# Patient Record
Sex: Male | Born: 1980 | Race: Black or African American | Hispanic: No | Marital: Single | Smoking: Current every day smoker
Health system: Southern US, Community
[De-identification: ages and names within clinical notes are randomized; demographics above are authoritative.]

## PROBLEM LIST (undated history)

## (undated) HISTORY — PX: HERNIA REPAIR: SHX51

---
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
## 2002-10-12 ENCOUNTER — Emergency Department (HOSPITAL_COMMUNITY): Admission: EM | Admit: 2002-10-12 | Discharge: 2002-10-12 | Payer: Self-pay | Admitting: Emergency Medicine

## 2002-10-14 ENCOUNTER — Emergency Department (HOSPITAL_COMMUNITY): Admission: EM | Admit: 2002-10-14 | Discharge: 2002-10-14 | Payer: Self-pay | Admitting: Emergency Medicine

## 2002-10-22 ENCOUNTER — Emergency Department (HOSPITAL_COMMUNITY): Admission: EM | Admit: 2002-10-22 | Discharge: 2002-10-22 | Payer: Self-pay | Admitting: Emergency Medicine

## 2002-10-29 ENCOUNTER — Emergency Department (HOSPITAL_COMMUNITY): Admission: EM | Admit: 2002-10-29 | Discharge: 2002-10-29 | Payer: Self-pay | Admitting: Diagnostic Radiology

## 2003-02-25 ENCOUNTER — Emergency Department (HOSPITAL_COMMUNITY): Admission: EM | Admit: 2003-02-25 | Discharge: 2003-02-25 | Payer: Self-pay | Admitting: Emergency Medicine

## 2005-02-01 ENCOUNTER — Emergency Department (HOSPITAL_COMMUNITY): Admission: EM | Admit: 2005-02-01 | Discharge: 2005-02-01 | Payer: Self-pay | Admitting: Emergency Medicine

## 2005-03-03 ENCOUNTER — Emergency Department (HOSPITAL_COMMUNITY): Admission: EM | Admit: 2005-03-03 | Discharge: 2005-03-03 | Payer: Self-pay | Admitting: Emergency Medicine

## 2006-03-21 ENCOUNTER — Ambulatory Visit: Payer: Self-pay | Admitting: Family Medicine

## 2006-04-15 ENCOUNTER — Ambulatory Visit: Payer: Self-pay | Admitting: Family Medicine

## 2006-04-16 ENCOUNTER — Ambulatory Visit: Payer: Self-pay | Admitting: *Deleted

## 2006-09-16 ENCOUNTER — Ambulatory Visit: Payer: Self-pay | Admitting: Internal Medicine

## 2006-10-24 ENCOUNTER — Ambulatory Visit: Payer: Self-pay | Admitting: Internal Medicine

## 2007-01-12 ENCOUNTER — Emergency Department (HOSPITAL_COMMUNITY): Admission: EM | Admit: 2007-01-12 | Discharge: 2007-01-12 | Payer: Self-pay | Admitting: Emergency Medicine

## 2007-03-19 ENCOUNTER — Encounter (INDEPENDENT_AMBULATORY_CARE_PROVIDER_SITE_OTHER): Payer: Self-pay | Admitting: *Deleted

## 2008-11-23 ENCOUNTER — Emergency Department (HOSPITAL_COMMUNITY): Admission: EM | Admit: 2008-11-23 | Discharge: 2008-11-23 | Payer: Self-pay | Admitting: Emergency Medicine

## 2010-06-01 ENCOUNTER — Emergency Department (HOSPITAL_COMMUNITY)
Admission: EM | Admit: 2010-06-01 | Discharge: 2010-06-01 | Payer: Self-pay | Source: Home / Self Care | Admitting: Family Medicine

## 2011-06-08 ENCOUNTER — Emergency Department (HOSPITAL_COMMUNITY)
Admission: EM | Admit: 2011-06-08 | Discharge: 2011-06-08 | Disposition: A | Discharge status: Home / Self Care | Payer: No Typology Code available for payment source | Attending: Emergency Medicine | Admitting: Emergency Medicine

## 2011-06-08 ENCOUNTER — Encounter: Payer: Self-pay | Admitting: Emergency Medicine

## 2011-06-08 ENCOUNTER — Emergency Department (HOSPITAL_COMMUNITY): Payer: No Typology Code available for payment source

## 2011-06-08 DIAGNOSIS — S39012A Strain of muscle, fascia and tendon of lower back, initial encounter: Secondary | ICD-10-CM

## 2011-06-08 DIAGNOSIS — Y92410 Unspecified street and highway as the place of occurrence of the external cause: Secondary | ICD-10-CM | POA: Insufficient documentation

## 2011-06-08 DIAGNOSIS — S335XXA Sprain of ligaments of lumbar spine, initial encounter: Secondary | ICD-10-CM | POA: Insufficient documentation

## 2011-06-08 MED ORDER — IBUPROFEN 800 MG PO TABS: 800.0000 mg | ORAL_TABLET | Freq: Three times a day (TID) | ORAL | Status: AC | PRN

## 2011-06-08 MED ORDER — HYDROCODONE-ACETAMINOPHEN 5-325 MG PO TABS: 1.0000 | ORAL_TABLET | Freq: Four times a day (QID) | ORAL | Status: AC | PRN

## 2011-06-08 NOTE — ED Provider Notes (Signed)
Medical screening examination/treatment/procedure(s) were performed by non-physician practitioner and as supervising physician I was immediately available for consultation/collaboration.  Kimi Bordeau, MD 06/08/11 1652 
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
## 2011-11-02 ENCOUNTER — Emergency Department (INDEPENDENT_AMBULATORY_CARE_PROVIDER_SITE_OTHER)
Admission: EM | Admit: 2011-11-02 | Discharge: 2011-11-02 | Disposition: A | Discharge status: Home / Self Care | Payer: Self-pay | Source: Home / Self Care | Attending: Emergency Medicine | Admitting: Emergency Medicine

## 2011-11-02 ENCOUNTER — Encounter (HOSPITAL_COMMUNITY): Payer: Self-pay | Admitting: *Deleted

## 2011-11-02 DIAGNOSIS — R369 Urethral discharge, unspecified: Secondary | ICD-10-CM

## 2011-11-02 MED ORDER — CEFTRIAXONE SODIUM 250 MG IJ SOLR
INTRAMUSCULAR | Status: AC
  Filled 2011-11-02: qty 250

## 2011-11-02 MED ORDER — AZITHROMYCIN 250 MG PO TABS
ORAL_TABLET | ORAL | Status: AC
  Filled 2011-11-02: qty 4

## 2011-11-02 MED ORDER — CEFTRIAXONE SODIUM 250 MG IJ SOLR
250.0000 mg | Freq: Once | INTRAMUSCULAR | Status: AC
  Administered 2011-11-02: 250 mg via INTRAMUSCULAR

## 2011-11-02 MED ORDER — AZITHROMYCIN 250 MG PO TABS
1000.0000 mg | ORAL_TABLET | Freq: Once | ORAL | Status: AC
  Administered 2011-11-02: 1000 mg via ORAL

## 2011-11-02 MED ORDER — LIDOCAINE HCL (PF) 1 % IJ SOLN
INTRAMUSCULAR | Status: AC
  Filled 2011-11-02: qty 5

## 2011-11-02 NOTE — ED Notes (Signed)
Pt is here with complaints of possible STD exposure.  Complaining of burning with urination and white penile discharge.  Denies pain.
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
## 2011-11-05 ENCOUNTER — Telehealth (HOSPITAL_COMMUNITY): Payer: Self-pay | Admitting: *Deleted

## 2011-11-05 NOTE — ED Notes (Signed)
Pt. called in for his lab results. Verified x 2 and given neg. GC/Chlamydia results. Vassie Moselle 11/05/2011

## 2012-02-27 ENCOUNTER — Encounter (HOSPITAL_COMMUNITY): Payer: Self-pay | Admitting: *Deleted

## 2012-02-27 ENCOUNTER — Emergency Department (HOSPITAL_COMMUNITY)
Admission: EM | Admit: 2012-02-27 | Discharge: 2012-02-27 | Disposition: A | Discharge status: Home / Self Care | Payer: 59 | Source: Home / Self Care | Attending: Family Medicine | Admitting: Family Medicine

## 2012-02-27 DIAGNOSIS — H00019 Hordeolum externum unspecified eye, unspecified eyelid: Secondary | ICD-10-CM

## 2012-02-27 DIAGNOSIS — H01009 Unspecified blepharitis unspecified eye, unspecified eyelid: Secondary | ICD-10-CM

## 2012-02-27 DIAGNOSIS — H01006 Unspecified blepharitis left eye, unspecified eyelid: Secondary | ICD-10-CM

## 2012-02-27 DIAGNOSIS — H00016 Hordeolum externum left eye, unspecified eyelid: Secondary | ICD-10-CM

## 2012-02-27 MED ORDER — IBUPROFEN 600 MG PO TABS: 600.0000 mg | ORAL_TABLET | Freq: Three times a day (TID) | ORAL | Status: DC

## 2012-02-27 MED ORDER — ERYTHROMYCIN 5 MG/GM OP OINT: TOPICAL_OINTMENT | OPHTHALMIC | Status: DC

## 2012-02-27 MED ORDER — DOXYCYCLINE HYCLATE 100 MG PO CAPS: 100.0000 mg | ORAL_CAPSULE | Freq: Two times a day (BID) | ORAL | Status: DC

## 2012-02-27 NOTE — ED Notes (Signed)
2 days of left eye redness and pain

## 2012-02-29 NOTE — ED Provider Notes (Signed)
History     CSN: [id number]  Arrival date & time 02/27/12  1122   First MD Initiated Contact with Patient 02/27/12 1147      Chief Complaint  Patient presents with  . Eye Pain    (Consider location/radiation/quality/duration/timing/severity/associated sxs/prior treatment) HPI Comments: 31 y/o male, smoker otherwise no significant PMH here c/o left eye redness, tenderness, swelling and drainage from upper lid for 2 days. Using warm compresses since yesterday started to drain last night. No pain with eye movements.    History reviewed. No pertinent past medical history.  Past Surgical History  Procedure Date  . Hernia repair     Family History  Problem Relation Age of Onset  . Hypertension Mother   . Diabetes Mother     History  Substance Use Topics  . Smoking status: Current Everyday Smoker  . Smokeless tobacco: Not on file  . Alcohol Use: Yes      Review of Systems  Constitutional: Negative for fever and chills.  HENT: Negative for congestion, sore throat, rhinorrhea, neck pain, neck stiffness and sinus pressure.   Eyes: Positive for pain, discharge and redness. Negative for photophobia and visual disturbance.       Symptoms related to left upper lid as per HPI  Respiratory: Negative for cough.   Neurological: Negative for dizziness and headaches.    Allergies  Review of patient's allergies indicates no known allergies.  Home Medications   Current Outpatient Rx  Name Route Sig Dispense Refill  . DOXYCYCLINE HYCLATE 100 MG PO CAPS Oral Take 1 capsule (100 mg total) by mouth 2 (two) times daily. 14 capsule 0  . ERYTHROMYCIN 5 MG/GM OP OINT  Place a 1/2 inch ribbon of ointment into the left lower eyelid TID for 5 days 1 g 0  . IBUPROFEN 600 MG PO TABS Oral Take 1 tablet (600 mg total) by mouth 3 (three) times daily. 20 tablet 0    BP 141/89  Pulse 68  Temp 98.1 F (36.7 C) (Oral)  Resp 18  SpO2 99%  Physical Exam  Nursing note and vitals  reviewed. Constitutional: He is oriented to person, place, and time. He appears well-developed and well-nourished. No distress.  HENT:  Head: Normocephalic and atraumatic.  Right Ear: External ear normal.  Mouth/Throat: Oropharynx is clear and moist. No oropharyngeal exudate.  Eyes: Conjunctivae and EOM are normal. Pupils are equal, round, and reactive to light.       Left upper lid: Mild swelling erythema and small induration close to eyelashes drainage point. There is small white /yellow drainage. Mild associated upper and lower blepharitis. No periorbital edema erythema or tenderness.  Right upper lid with non infected chalazion.   Neck: Neck supple.  Cardiovascular: Normal rate, regular rhythm and normal heart sounds.  Exam reveals no gallop and no friction rub.   No murmur heard. Pulmonary/Chest: Breath sounds normal.  Lymphadenopathy:    He has no cervical adenopathy.  Neurological: He is alert and oriented to person, place, and time.  Skin: No rash noted.    ED Course  Procedures (including critical care time)  Labs Reviewed - No data to display No results found.   1. Hordeolum externum of left eye   2. Blepharitis of left eye       MDM  Treated with doxycycline oral and erythromycin topical. Ophthalmology referral provided as needed.         Sharin Grave, MD 03/02/12 [phone]

## 2012-03-01 ENCOUNTER — Emergency Department (HOSPITAL_COMMUNITY)
Admission: EM | Admit: 2012-03-01 | Discharge: 2012-03-01 | Disposition: A | Discharge status: Home / Self Care | Payer: 59 | Attending: Emergency Medicine | Admitting: Emergency Medicine

## 2012-03-01 ENCOUNTER — Encounter (HOSPITAL_COMMUNITY): Payer: Self-pay | Admitting: Emergency Medicine

## 2012-03-01 DIAGNOSIS — H00019 Hordeolum externum unspecified eye, unspecified eyelid: Secondary | ICD-10-CM | POA: Insufficient documentation

## 2012-03-01 DIAGNOSIS — F17200 Nicotine dependence, unspecified, uncomplicated: Secondary | ICD-10-CM | POA: Insufficient documentation

## 2012-03-01 NOTE — ED Notes (Addendum)
Pt c/o left eye redness and pain onset Monday. Pt seen at urgent care for same and given medications without relief. Left eye redness noted with small bump on lower eye lid noted.

## 2012-03-01 NOTE — ED Provider Notes (Signed)
History  This chart was scribed for Elijah Kaplan, MD by Erskine Emery. This patient was seen in room TR02C/TR02C and the patient's care was started at 14:49.   CSN: [id number]  Arrival date & time 03/01/12  1436   First MD Initiated Contact with Patient 03/01/12 1449      Chief Complaint  Patient presents with  . Eye Pain    left eye    (Consider location/radiation/quality/duration/timing/severity/associated sxs/prior treatment) HPI Sabre A Gaba is a 31 y.o. male who presents to the Emergency Department complaining of left eye irritation, redness, itching, yellow discharge, and periorbital swelling since Monday (5 days). Pt denies any eye pain, visual changes, or presence of foreign bodies in the eye. Pt reports when he wakes up his top and bottom lids on the left eye are stuck shut, with noticeable crusting, and only will open after he has put a warm wet rag no the area. Pt went to Urgent Care a week ago and was given antibiotics and gel to rub on the eye that he has been taking as prescribed.   History reviewed. No pertinent past medical history.  Past Surgical History  Procedure Date  . Hernia repair     Family History  Problem Relation Age of Onset  . Hypertension Mother   . Diabetes Mother     History  Substance Use Topics  . Smoking status: Current Everyday Smoker  . Smokeless tobacco: Not on file  . Alcohol Use: Yes      Review of Systems  Constitutional: Negative for fever and chills.  Eyes: Positive for discharge, redness and itching. Negative for pain and visual disturbance.  Respiratory: Negative for shortness of breath.   Gastrointestinal: Negative for nausea and vomiting.  Neurological: Negative for weakness.    Allergies  Review of patient's allergies indicates no known allergies.  Home Medications   Current Outpatient Rx  Name Route Sig Dispense Refill  . DOXYCYCLINE HYCLATE 100 MG PO CAPS Oral Take 100 mg by mouth 2 (two) times daily.    .  ERYTHROMYCIN 5 MG/GM OP OINT Left Eye Place into the left eye See admin instructions. Place a 1/2 inch ribbon of ointment into the left lower eyelid TID for 5 days      BP 153/82  Pulse 83  Temp 98.2 F (36.8 C) (Oral)  Resp 18  SpO2 100%  Physical Exam  Nursing note and vitals reviewed. Constitutional: He is oriented to person, place, and time. He appears well-developed and well-nourished. No distress.  HENT:  Head: Normocephalic and atraumatic.  Eyes: EOM are normal. Pupils are equal, round, and reactive to light. No scleral icterus.       Right eyelid: macular lesion over the top of the eyelid. No erythema or injection. Sclera is clear.  Left eye: periorbital swellling with no erythema. The bottom lid has papular lesions with no pustules. Sclera are slightly injected, medially and laterally but are otherwise clear.  Neck: Neck supple. No tracheal deviation present.       No pre or post auricular lymphadenopathy.  Cardiovascular: Normal rate, regular rhythm and normal heart sounds.   Pulmonary/Chest: Effort normal and breath sounds normal. No respiratory distress. He has no wheezes.  Abdominal: Soft. There is no tenderness.  Musculoskeletal: Normal range of motion.  Neurological: He is alert and oriented to person, place, and time.  Skin: Skin is warm and dry.  Psychiatric: He has a normal mood and affect. His behavior is normal.  ED Course  Procedures (including critical care time)  DIAGNOSTIC STUDIES: Oxygen Saturation is 100% on room air, normal by my interpretation.    COORDINATION OF CARE: 15:20--I evaluated the patient and we discussed a treatment plan including a warm compress and antibiotics to which the pt agreed. I told the pt it may take a week-10 days for the symptoms to disappear.    Labs Reviewed - No data to display No results found.   No diagnosis found.    MDM  Pt comes in with cc of eye irritation. Was seen at urgent care recently, on po  antibiotics and using warm compresses. Eye exam is shows some periorbital swelling, no cellulitis, and both the eyes have stye, with no purulence. No concerns for abrasions or ulcers of the eye or any corneal, retinal injury based on hx..  Advised to use warm compresses. PCP f/u.     the metatarsal over the greattoe is tenderwithsome skin changes, diffuse bilateral toe pain with skin changes.  Elijah Kaplan, MD 03/01/12 [phone]

## 2012-03-30 ENCOUNTER — Emergency Department (HOSPITAL_COMMUNITY)
Admission: EM | Admit: 2012-03-30 | Discharge: 2012-03-31 | Disposition: A | Discharge status: Home / Self Care | Payer: Self-pay | Attending: Emergency Medicine | Admitting: Emergency Medicine

## 2012-03-30 DIAGNOSIS — Z0389 Encounter for observation for other suspected diseases and conditions ruled out: Secondary | ICD-10-CM | POA: Insufficient documentation

## 2012-03-30 NOTE — ED Notes (Signed)
Pt called for triage x2 without response 

## 2012-03-31 ENCOUNTER — Emergency Department (HOSPITAL_COMMUNITY)
Admission: EM | Admit: 2012-03-31 | Discharge: 2012-03-31 | Disposition: A | Discharge status: Home / Self Care | Payer: Self-pay | Attending: Emergency Medicine | Admitting: Emergency Medicine

## 2012-03-31 ENCOUNTER — Encounter (HOSPITAL_COMMUNITY): Payer: Self-pay | Admitting: Family Medicine

## 2012-03-31 DIAGNOSIS — F17200 Nicotine dependence, unspecified, uncomplicated: Secondary | ICD-10-CM | POA: Insufficient documentation

## 2012-03-31 DIAGNOSIS — H00019 Hordeolum externum unspecified eye, unspecified eyelid: Secondary | ICD-10-CM | POA: Insufficient documentation

## 2012-03-31 DIAGNOSIS — I10 Essential (primary) hypertension: Secondary | ICD-10-CM | POA: Insufficient documentation

## 2012-03-31 DIAGNOSIS — E119 Type 2 diabetes mellitus without complications: Secondary | ICD-10-CM | POA: Insufficient documentation

## 2012-03-31 DIAGNOSIS — L0100 Impetigo, unspecified: Secondary | ICD-10-CM | POA: Insufficient documentation

## 2012-03-31 MED ORDER — DOXYCYCLINE HYCLATE 100 MG PO CAPS: 100.0000 mg | ORAL_CAPSULE | Freq: Two times a day (BID) | ORAL | Status: DC

## 2012-03-31 NOTE — ED Provider Notes (Signed)
History  This chart was scribed for Elijah Bernhardt, MD by Elijah Walsh. This patient was seen in room TR08C/TR08C and the patient's care was started at 8:40PM.  CSN: [id number]  Arrival date & time 03/31/12  1749   First MD Initiated Contact with Patient 03/31/12  2040    Chief Complaint  Patient presents with  . Insect Bite    The history is provided by the patient. No language interpreter was used.    [name] is a 31 y.o. male who presents to the Emergency Department complaining of sudden onset, non-changing, constant insect bites to knee and arm that occurred when he saw a spider crawl out of his shirt at his son's football game 2 days ago. He reports purulent drainage from the areas and states that the areas are itchy. He denies fever, nausea, emesis, HA and rash as associated symptoms.He does not have a h/o chronic medical conditions. He is a current everyday smoker and occasional alcohol user.   History reviewed. No pertinent past medical history.  Past Surgical History  Procedure Date  . Hernia repair     Family History  Problem Relation Age of Onset  . Hypertension Mother   . Diabetes Mother     History  Substance Use Topics  . Smoking status: Current Every Day Smoker  . Smokeless tobacco: Not on file  . Alcohol Use: Yes      Review of Systems  A complete 10 system review of systems was obtained and all systems are negative except as noted in the HPI and PMH.    Allergies  Review of patient's allergies indicates no known allergies.  Home Medications   Current Outpatient Rx  Name Route Sig Dispense Refill  . PRESCRIPTION MEDICATION Oral Take 1 tablet by mouth daily. Anti biotic, finished taking today 03-31-12    . DOXYCYCLINE HYCLATE 100 MG PO CAPS Oral Take 1 capsule (100 mg total) by mouth 2 (two) times daily. 20 capsule 0    Triage Vitals: BP 151/87  Pulse 92  Temp 97.7 F (36.5 C) (Oral)  Resp 18  SpO2 100%  Physical Exam  Nursing  note and vitals reviewed. Constitutional: He is oriented to person, place, and time. He appears well-developed and well-nourished. No distress.  HENT:  Head: Normocephalic and atraumatic.  Eyes: Conjunctivae normal and EOM are normal.       Sty on the left lower eyelid and left upper eyelid  Neck: Neck supple. No tracheal deviation present.  Cardiovascular: Normal rate.   Pulmonary/Chest: Effort normal. No respiratory distress.  Abdominal: He exhibits no distension.  Musculoskeletal: Normal range of motion.  Neurological: He is alert and oriented to person, place, and time. No sensory deficit.  Skin: Skin is dry.       Small slightly raised erythematous areas with excoriation and purulent drainage on the left elbow, left hand, right ankle, and three on the left knee, no fluctuance   Psychiatric: He has a normal mood and affect. His behavior is normal.    ED Course  Procedures (including critical care time)  DIAGNOSTIC STUDIES: Oxygen Saturation is 100% on room air, normal by my interpretation.    COORDINATION OF CARE: 8:55PM-Discussed discharge plan which includes cleaning the areas with antibacterial soap, antibiotics and applying heat to the sty with pt at bedside and pt agreed to plan.   Labs Reviewed - No data to display No results found.   1. Impetigo   2. Hordeolum  MDM  Scattered skin infection, without evidence for cellulitis, sepsis, metabolic instability.   Plan: Home Medications- doxycycline; Home Treatments- wash areas with antibacterial soap, apply heat to the sty, take antibiotics as prescribed; Recommended follow up- with ophthalmologist if sty doesn't improve      I personally performed the services described in this documentation, which was scribed in my presence. The recorded information has been reviewed and considered.     Flint Melter, MD 04/04/12 [phone]

## 2012-03-31 NOTE — ED Notes (Signed)
Pt for discharge.Vital signs stable.GCS 15 

## 2012-03-31 NOTE — ED Notes (Signed)
Per pt multiple bites to knee, and arm. sts was at his sons football game and saw spiders crawl out of his shirt. sts areas have been draining and are itchy.

## 2012-09-28 ENCOUNTER — Emergency Department (HOSPITAL_COMMUNITY)
Admission: EM | Admit: 2012-09-28 | Discharge: 2012-09-28 | Disposition: A | Discharge status: Home / Self Care | Payer: 59 | Attending: Emergency Medicine | Admitting: Emergency Medicine

## 2012-09-28 ENCOUNTER — Encounter (HOSPITAL_COMMUNITY): Payer: Self-pay | Admitting: Emergency Medicine

## 2012-09-28 DIAGNOSIS — H00014 Hordeolum externum left upper eyelid: Secondary | ICD-10-CM

## 2012-09-28 DIAGNOSIS — H00019 Hordeolum externum unspecified eye, unspecified eyelid: Secondary | ICD-10-CM | POA: Insufficient documentation

## 2012-09-28 DIAGNOSIS — R21 Rash and other nonspecific skin eruption: Secondary | ICD-10-CM | POA: Insufficient documentation

## 2012-09-28 DIAGNOSIS — F17200 Nicotine dependence, unspecified, uncomplicated: Secondary | ICD-10-CM | POA: Insufficient documentation

## 2012-09-28 MED ORDER — CEPHALEXIN 500 MG PO CAPS: 500.0000 mg | ORAL_CAPSULE | Freq: Two times a day (BID) | ORAL | Status: DC

## 2012-09-28 NOTE — ED Provider Notes (Signed)
History     CSN: [id number]  Arrival date & time 09/28/12  1058   First MD Initiated Contact with Patient 09/28/12 1156      Chief Complaint  Patient presents with  . Belepharitis    (Consider location/radiation/quality/duration/timing/severity/associated sxs/prior treatment) HPI  Elijah Walsh is a 32 y.o. male complaining of right upper eyelid swelling getting worse over the course of the last week. Patient has had multiple similar prior exacerbations. He's been using warm compresses approximately once per day with little relief. He's never had a cystitis large. He denies fever, change in vision, pain with eye movement.   History reviewed. No pertinent past medical history.  Past Surgical History  Procedure Laterality Date  . Hernia repair      Family History  Problem Relation Age of Onset  . Hypertension Mother   . Diabetes Mother     History  Substance Use Topics  . Smoking status: Current Every Day Smoker  . Smokeless tobacco: Not on file  . Alcohol Use: Yes      Review of Systems  Constitutional: Negative for fever.  Respiratory: Negative for shortness of breath.   Cardiovascular: Negative for chest pain.  Gastrointestinal: Negative for nausea, vomiting, abdominal pain and diarrhea.  Skin: Positive for rash.  All other systems reviewed and are negative.    Allergies  Review of patient's allergies indicates no known allergies.  Home Medications   Current Outpatient Rx  Name  Route  Sig  Dispense  Refill  . cephALEXin (KEFLEX) 500 MG capsule   Oral   Take 1 capsule (500 mg total) by mouth 2 (two) times daily.   14 capsule   0     BP 167/97  Pulse 75  Temp(Src) 97.5 F (36.4 C) (Oral)  Resp 20  SpO2 98%  Physical Exam  Nursing note and vitals reviewed. Constitutional: He is oriented to person, place, and time. He appears well-developed and well-nourished. No distress.  HENT:  Head: Normocephalic.  Mouth/Throat: Oropharynx is clear and  moist.  Eyes: Conjunctivae and EOM are normal. Pupils are equal, round, and reactive to light.    Neck: Normal range of motion.  Cardiovascular: Normal rate.   Pulmonary/Chest: Effort normal. No stridor.  Musculoskeletal: Normal range of motion.  Lymphadenopathy:    He has no cervical adenopathy.  Neurological: He is alert and oriented to person, place, and time.  Psychiatric: He has a normal mood and affect.    ED Course  Procedures (including critical care time)  Labs Reviewed - No data to display No results found.   1. Hordeolum externum of left upper eyelid       MDM   Elijah Walsh is a 31 y.o. male  With right upper eyelid hordeolum.   Filed Vitals:   09/28/12 1128  BP: 167/97  Pulse: 75  Temp: 97.5 F (36.4 C)  TempSrc: Oral  Resp: 20  SpO2: 98%     Pt verbalized understanding and agrees with care plan. Outpatient follow-up and return precautions given.    Discharge Medication List as of 09/28/2012 12:32 PM    START taking these medications   Details  cephALEXin (KEFLEX) 500 MG capsule Take 1 capsule (500 mg total) by mouth 2 (two) times daily., Starting 09/28/2012, Until Discontinued, [employer], PA-C 09/29/12 [phone]

## 2012-09-28 NOTE — ED Notes (Signed)
Pt c/o swelling to R eyelid. Began 1 week ago and has gotten progressively worse. Eyelid red and swollen in center.

## 2012-09-29 NOTE — ED Provider Notes (Signed)
Medical screening examination/treatment/procedure(s) were performed by non-physician practitioner and as supervising physician I was immediately available for consultation/collaboration.  Flint Melter, MD 09/29/12 [phone]

## 2013-05-10 ENCOUNTER — Encounter (HOSPITAL_COMMUNITY): Payer: Self-pay | Admitting: Emergency Medicine

## 2013-05-10 ENCOUNTER — Emergency Department (HOSPITAL_COMMUNITY)
Admission: EM | Admit: 2013-05-10 | Discharge: 2013-05-10 | Disposition: A | Discharge status: Home / Self Care | Payer: 59 | Attending: Emergency Medicine | Admitting: Emergency Medicine

## 2013-05-10 DIAGNOSIS — R21 Rash and other nonspecific skin eruption: Secondary | ICD-10-CM

## 2013-05-10 DIAGNOSIS — Z792 Long term (current) use of antibiotics: Secondary | ICD-10-CM | POA: Insufficient documentation

## 2013-05-10 DIAGNOSIS — F17200 Nicotine dependence, unspecified, uncomplicated: Secondary | ICD-10-CM | POA: Insufficient documentation

## 2013-05-10 DIAGNOSIS — R000 Tachycardia, unspecified: Secondary | ICD-10-CM | POA: Insufficient documentation

## 2013-05-10 MED ORDER — PERMETHRIN 5 % EX CREA: TOPICAL_CREAM | CUTANEOUS | Status: DC

## 2013-05-10 MED ORDER — CEPHALEXIN 500 MG PO CAPS: 500.0000 mg | ORAL_CAPSULE | Freq: Four times a day (QID) | ORAL | Status: DC

## 2013-05-10 MED ORDER — DIPHENHYDRAMINE HCL 25 MG PO TABS: 25.0000 mg | ORAL_TABLET | Freq: Four times a day (QID) | ORAL | Status: DC

## 2013-05-10 NOTE — ED Notes (Signed)
Pt with pruritic rash since yesterday.  States he is a [employer] so he stays in a lot of hotels.

## 2013-05-10 NOTE — ED Provider Notes (Signed)
Medical screening examination/treatment/procedure(s) were performed by non-physician practitioner and as supervising physician I was immediately available for consultation/collaboration.  EKG Interpretation   None        Geoffery Lyons, MD 05/10/13 1558

## 2013-05-10 NOTE — ED Provider Notes (Signed)
CSN: [id number]     Arrival date & time 05/10/13  1408 History  This chart was scribed for non-physician practitioner Fayrene Helper, working with Geoffery Lyons, MD by Carl Best, ED Scribe. This patient was seen in room TR05C/TR05C and the patient's care was started at 3:17 PM.     Chief Complaint  Patient presents with  . insect bites     The history is provided by the patient. No language interpreter was used.   HPI Comments: Elijah Walsh is a 32 y.o. male who presents to the Emergency Department complaining of an itchy, puruitic rash that started yesterday.  The patient suspects that he was bitten by something in the hotel bed he was sleeping in.  He denies trouble breathing and trouble swalloing as associated symptoms.  He states that he has applied cortisone to the affected areas.  He denies seeing bed bugs or any other insect that may have bitten him.  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  No changes in soap, detergent, having new pets, or medication changes.    History reviewed. No pertinent past medical history. Past Surgical History  Procedure Laterality Date  . Hernia repair     Family History  Problem Relation Age of Onset  . Hypertension Mother   . Diabetes Mother    History  Substance Use Topics  . Smoking status: Current Every Day Smoker -- 1.00 packs/day    Types: Cigarettes  . Smokeless tobacco: Not on file  . Alcohol Use: Yes     Comment: occ    Review of Systems  Constitutional: Negative for fever.  Respiratory: Negative for shortness of breath.   Skin: Positive for rash.  Neurological: Negative for headaches.    Allergies  Review of patient's allergies indicates no known allergies.  Home Medications   Current Outpatient Rx  Name  Route  Sig  Dispense  Refill  . cephALEXin (KEFLEX) 500 MG capsule   Oral   Take 1 capsule (500 mg total) by mouth 2 (two) times daily.   14 capsule   0    Triage Vitals: BP 137/76  Pulse 100  Temp(Src)  98.1 F (36.7 C) (Oral)  Resp 16  Ht 6' (1.829 m)  Wt 291 lb 12.8 oz (132.36 kg)  BMI 39.57 kg/m2  SpO2 100%  Physical Exam  Nursing note and vitals reviewed. Constitutional: He is oriented to person, place, and time. He appears well-developed and well-nourished. No distress.  HENT:  Head: Normocephalic and atraumatic.  Mouth/Throat: Oropharynx is clear and moist. No oropharyngeal exudate.  Neck: Neck supple. No tracheal deviation present.  Cardiovascular:  Mild tachycardia  Pulmonary/Chest: Effort normal and breath sounds normal. No respiratory distress. He has no wheezes.  Musculoskeletal: Normal range of motion.  Neurological: He is alert and oriented to person, place, and time.  Skin: Skin is warm and dry.  Multiple raised erythematous lesions noted on left arm, right upper arm, left upper back, left dorsum of hand. Lesions are mildly indurated with small vesicles.  No lesions in the mouth or palms of hands.   Psychiatric: He has a normal mood and affect. His behavior is normal.    ED Course  Procedures (including critical care time)  DIAGNOSTIC STUDIES: Oxygen Saturation is 100% on room air, normal by my interpretation.    COORDINATION OF CARE: Rash, likely bedbugs.  No red flags.  Plan to provider permethrin cream, keflex, benadryl.  Pt instruct to wash all clothes and belonging in  hot water.     Labs Review Labs Reviewed - No data to display Imaging Review No results found.  EKG Interpretation   None       MDM   1. Rash    BP 137/76  Pulse 100  Temp(Src) 98.1 F (36.7 C) (Oral)  Resp 16  Ht 6' (1.829 m)  Wt 291 lb 12.8 oz (132.36 kg)  BMI 39.57 kg/m2  SpO2 100%   I personally performed the services described in this documentation, which was scribed in my presence. The recorded information has been reviewed and is accurate.     Fayrene Helper, PA-C 05/10/13 [phone]

## 2014-02-23 ENCOUNTER — Emergency Department (HOSPITAL_COMMUNITY)
Admission: EM | Admit: 2014-02-23 | Discharge: 2014-02-23 | Disposition: A | Discharge status: Home / Self Care | Payer: Commercial Managed Care - PPO | Attending: Emergency Medicine | Admitting: Emergency Medicine

## 2014-02-23 ENCOUNTER — Encounter (HOSPITAL_COMMUNITY): Payer: Self-pay | Admitting: Emergency Medicine

## 2014-02-23 DIAGNOSIS — Z79899 Other long term (current) drug therapy: Secondary | ICD-10-CM | POA: Diagnosis not present

## 2014-02-23 DIAGNOSIS — F17200 Nicotine dependence, unspecified, uncomplicated: Secondary | ICD-10-CM | POA: Insufficient documentation

## 2014-02-23 DIAGNOSIS — Z202 Contact with and (suspected) exposure to infections with a predominantly sexual mode of transmission: Secondary | ICD-10-CM | POA: Diagnosis not present

## 2014-02-23 DIAGNOSIS — N390 Urinary tract infection, site not specified: Secondary | ICD-10-CM | POA: Diagnosis not present

## 2014-02-23 DIAGNOSIS — Z792 Long term (current) use of antibiotics: Secondary | ICD-10-CM | POA: Diagnosis not present

## 2014-02-23 DIAGNOSIS — I10 Essential (primary) hypertension: Secondary | ICD-10-CM

## 2014-02-23 LAB — URINALYSIS, ROUTINE W REFLEX MICROSCOPIC
BILIRUBIN URINE: NEGATIVE
Glucose, UA: NEGATIVE mg/dL
KETONES UR: NEGATIVE mg/dL
NITRITE: NEGATIVE
PH: 8 (ref 5.0–8.0)
PROTEIN: NEGATIVE mg/dL
Specific Gravity, Urine: 1.014 (ref 1.005–1.030)
UROBILINOGEN UA: 1 mg/dL (ref 0.0–1.0)

## 2014-02-23 LAB — HIV ANTIBODY (ROUTINE TESTING W REFLEX): HIV: NONREACTIVE

## 2014-02-23 LAB — RPR

## 2014-02-23 LAB — URINE MICROSCOPIC-ADD ON

## 2014-02-23 MED ORDER — CEPHALEXIN 500 MG PO CAPS: 500.0000 mg | ORAL_CAPSULE | Freq: Two times a day (BID) | ORAL | Status: DC

## 2014-02-23 MED ORDER — CEFTRIAXONE SODIUM 250 MG IJ SOLR
250.0000 mg | Freq: Once | INTRAMUSCULAR | Status: AC
  Administered 2014-02-23: 250 mg via INTRAMUSCULAR
  Filled 2014-02-23: qty 250

## 2014-02-23 MED ORDER — LIDOCAINE HCL (PF) 1 % IJ SOLN
INTRAMUSCULAR | Status: AC
  Administered 2014-02-23: 0.9 mL
  Filled 2014-02-23: qty 5

## 2014-02-23 MED ORDER — AZITHROMYCIN 250 MG PO TABS
1000.0000 mg | ORAL_TABLET | Freq: Once | ORAL | Status: AC
  Administered 2014-02-23: 1000 mg via ORAL
  Filled 2014-02-23: qty 4

## 2014-02-23 NOTE — ED Provider Notes (Signed)
CSN: [id number]     Arrival date & time 02/23/14  1147 History   First MD Initiated Contact with Patient 02/23/14 1255     Chief Complaint  Patient presents with  . Hypertension     (Consider location/radiation/quality/duration/timing/severity/associated sxs/prior Treatment) HPI Comments: Patient is a 33 yo M PMHx significant for tobacco abuse presenting to the ED for multiple complaints. 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 Denies any headaches, CP, SOB, visual disturbances, syncope. Does drinking multiple Red Bulls a day. Patient's second complaint is hematuria that he noticed yesterday, states he has had precipitating dysuria and penile discharge since Sunday. Does endorse recent unprotected sexual intercourse.   Patient is a 33 y.o. male presenting with hypertension.  Hypertension Pertinent negatives include no chest pain, chills, fever, headaches, numbness or weakness.    History reviewed. No pertinent past medical history. Past Surgical History  Procedure Laterality Date  . Hernia repair     Family History  Problem Relation Age of Onset  . Hypertension Mother   . Diabetes Mother    History  Substance Use Topics  . Smoking status: Current Every Day Smoker -- 1.00 packs/day    Types: Cigarettes  . Smokeless tobacco: Not on file  . Alcohol Use: Yes     Comment: occ    Review of Systems  Constitutional: Negative for fever and chills.  Respiratory: Negative for shortness of breath.   Cardiovascular: Negative for chest pain, palpitations and leg swelling.  Neurological: Negative for syncope, speech difficulty, weakness, numbness and headaches.  All other systems reviewed and are negative.     Allergies  Shellfish allergy  Home Medications   Prior to Admission medications   Medication Sig  Start Date End Date Taking? Authorizing Provider  cephALEXin (KEFLEX) 500 MG capsule Take 1 capsule (500 mg total) by mouth 4 (four) times daily. 05/10/13   Fayrene Helper, PA-C  diphenhydrAMINE (BENADRYL) 25 MG tablet Take 1 tablet (25 mg total) by mouth every 6 (six) hours. 05/10/13   Fayrene Helper, PA-C  DiphenhydrAMINE HCl (BENADRYL ALLERGY PO) Take 3 tablets by mouth every 6 (six) hours as needed (for rash).     Historical Provider, MD  permethrin (ELIMITE) 5 % cream Apply from neck down to the rest of body, leave it on for 8-10 hrs and wash it off.  Repeat in one week if no improvement. 05/10/13   Fayrene Helper, PA-C   BP 153/94  Pulse 83  Temp(Src) 97.9 F (36.6 C) (Oral)  Resp 17  SpO2 98% Physical Exam  Nursing note and vitals reviewed. Constitutional: He is oriented to person, place, and time. He appears well-developed and well-nourished. No distress.  HENT:  Head: Normocephalic and atraumatic.  Right Ear: External ear normal.  Left Ear: External ear normal.  Nose: Nose normal.  Mouth/Throat: Oropharynx is clear and moist.  Eyes: Conjunctivae are normal.  Neck: Normal range of motion. Neck supple.  Cardiovascular: Normal rate, regular rhythm and normal heart sounds.   Pulmonary/Chest: Effort normal and breath sounds normal. No respiratory distress.  Abdominal: Soft. Normal appearance and bowel sounds are normal. There is no tenderness. There is no rigidity, no rebound, no guarding and no CVA tenderness.  Genitourinary: Testes normal and penis normal. Right testis shows no swelling and no tenderness. Left  testis shows no swelling and no tenderness. Circumcised. No penile tenderness.  Musculoskeletal: Normal range of motion.  Lymphadenopathy:       Right: No inguinal adenopathy present.       Left: No inguinal adenopathy present.  Neurological: He is alert and oriented to person, place, and time.  Skin: Skin is warm and dry. He is not diaphoretic.  Psychiatric: He has a normal mood and  affect.    ED Course  Procedures (including critical care time)  Medications  cefTRIAXone (ROCEPHIN) injection 250 mg (250 mg Intramuscular Given 02/23/14 1402)  azithromycin (ZITHROMAX) tablet 1,000 mg (1,000 mg Oral Given 02/23/14 1402)  lidocaine (PF) (XYLOCAINE) 1 % injection (0.9 mLs  Given 02/23/14 1402)    Labs Review Labs Reviewed  URINALYSIS, ROUTINE W REFLEX MICROSCOPIC - Abnormal; Notable for the following:    APPearance CLOUDY (*)    Hgb urine dipstick TRACE (*)    Leukocytes, UA MODERATE (*)    All other components within normal limits  URINE CULTURE  GC/CHLAMYDIA PROBE AMP  URINE MICROSCOPIC-ADD ON  HIV ANTIBODY (ROUTINE TESTING)  RPR    Imaging Review No results found.   EKG Interpretation None      MDM   Final diagnoses:  UTI (lower urinary tract infection)  Essential hypertension  Possible exposure to STD    Filed Vitals:   02/23/14 1400  BP: 153/94  Pulse: 83  Temp:   Resp: 17   Afebrile, NAD, non-toxic appearing, AAOx4.   1) UTI: Pt has been diagnosed with a UTI. Pt is afebrile, no CVA tenderness, normotensive, and denies N/V. Pt to be dc home with antibiotics and instructions to follow up with PCP if symptoms persist.  2) HTN: Patient noted to be hypertensive in the emergency department.  No signs of hypertensive urgency.    3) Possible STD exposure: GC/Chlamydia sent. Prophylactically treating patient for GC/Chlamydia with Rocephin and Azithromycin. HIV and RPR sent. Discussed if positive patient will need to abstain from sexual intercourse for 10 days if GC and Chlamydia is positive.   Discussed with patient the need for close follow-up and management by their primary care physician.   Jeannetta Ellis, PA-C 02/23/14 1527

## 2014-02-23 NOTE — ED Notes (Signed)
Pt reports having high bp, diagnosed yesterday when he went for a physical and also had blood in urine but pt has no complaints and no pain. No acute distress noted at triage, bp is 168/92.

## 2014-02-23 NOTE — Discharge Instructions (Signed)
Please follow up with your primary care physician in 1-2 days. If you do not have one please call the [facility] number listed above. Please refrain from sex for 10 days if your GC/Chlamydia results are positive. Please take your antibiotic until completion. Please read all discharge instructions and return precautions.   Urinary Tract Infection Urinary tract infections (UTIs) can develop anywhere along your urinary tract. Your urinary tract is your body's drainage system for removing wastes and extra water. Your urinary tract includes two kidneys, two ureters, a bladder, and a urethra. Your kidneys are a pair of bean-shaped organs. Each kidney is about the size of your fist. They are located below your ribs, one on each side of your spine. CAUSES Infections are caused by microbes, which are microscopic organisms, including fungi, viruses, and bacteria. These organisms are so small that they can only be seen through a microscope. Bacteria are the microbes that most commonly cause UTIs. SYMPTOMS  Symptoms of UTIs may vary by age and gender of the patient and by the location of the infection. Symptoms in young women typically include a frequent and intense urge to urinate and a painful, burning feeling in the bladder or urethra during urination. Older women and men are more likely to be tired, shaky, and weak and have muscle aches and abdominal pain. A fever may mean the infection is in your kidneys. Other symptoms of a kidney infection include pain in your back or sides below the ribs, nausea, and vomiting. DIAGNOSIS To diagnose a UTI, your caregiver will ask you about your symptoms. Your caregiver also will ask to provide a urine sample. The urine sample will be tested for bacteria and white blood cells. White blood cells are made by your body to help fight infection. TREATMENT  Typically, UTIs can be treated with medication. Because most UTIs are caused by a bacterial infection, they  usually can be treated with the use of antibiotics. The choice of antibiotic and length of treatment depend on your symptoms and the type of bacteria causing your infection. HOME CARE INSTRUCTIONS  If you were prescribed antibiotics, take them exactly as your caregiver instructs you. Finish the medication even if you feel better after you have only taken some of the medication.  Drink enough water and fluids to keep your urine clear or pale yellow.  Avoid caffeine, tea, and carbonated beverages. They tend to irritate your bladder.  Empty your bladder often. Avoid holding urine for long periods of time.  Empty your bladder before and after sexual intercourse.  After a bowel movement, women should cleanse from front to back. Use each tissue only once. SEEK MEDICAL CARE IF:   You have back pain.  You develop a fever.  Your symptoms do not begin to resolve within 3 days. SEEK IMMEDIATE MEDICAL CARE IF:   You have severe back pain or lower abdominal pain.  You develop chills.  You have nausea or vomiting.  You have continued burning or discomfort with urination. MAKE SURE YOU:   Understand these instructions.  Will watch your condition.  Will get help right away if you are not doing well or get worse. Document Released: 03/28/2005 Document Revised: 12/18/2011 Document Reviewed: 07/27/2011 [facility] Patient Information 2015 [place], [place]. This information is not intended to replace advice given to you by your health care provider. Make sure you discuss any questions you have with your health care provider. Sexually Transmitted Disease A sexually transmitted disease (STD) is a  disease or infection that may be passed (transmitted) from person to person, usually during sexual activity. This may happen by way of saliva, semen, blood, vaginal mucus, or urine. Common STDs include:   Gonorrhea.   Chlamydia.   Syphilis.   HIV and AIDS.   Genital herpes.   Hepatitis B and  C.   Trichomonas.   Human papillomavirus (HPV).   Pubic lice.   Scabies.  Mites.  Bacterial vaginosis. WHAT ARE CAUSES OF STDs? An STD may be caused by bacteria, a virus, or parasites. STDs are often transmitted during sexual activity if one person is infected. However, they may also be transmitted through nonsexual means. STDs may be transmitted after:   Sexual intercourse with an infected person.   Sharing sex toys with an infected person.   Sharing needles with an infected person or using unclean piercing or tattoo needles.  Having intimate contact with the genitals, mouth, or rectal areas of an infected person.   Exposure to infected fluids during birth. WHAT ARE THE SIGNS AND SYMPTOMS OF STDs? Different STDs have different symptoms. Some people may not have any symptoms. If symptoms are present, they may include:   Painful or bloody urination.   Pain in the pelvis, abdomen, vagina, anus, throat, or eyes.   A skin rash, itching, or irritation.  Growths, ulcerations, blisters, or sores in the genital and anal areas.  Abnormal vaginal discharge with or without bad odor.   Penile discharge in men.   Fever.   Pain or bleeding during sexual intercourse.   Swollen glands in the groin area.   Yellow skin and eyes (jaundice). This is seen with hepatitis.   Swollen testicles.  Infertility.  Sores and blisters in the mouth. HOW ARE STDs DIAGNOSED? To make a diagnosis, your health care provider may:   Take a medical history.   Perform a physical exam.   Take a sample of any discharge to examine.  Swab the throat, cervix, opening to the penis, rectum, or vagina for testing.  Test a sample of your first morning urine.   Perform blood tests.   Perform a Pap test, if this applies.   Perform a colposcopy.   Perform a laparoscopy.  HOW ARE STDs TREATED? Treatment depends on the STD. Some STDs may be treated but not cured.    Chlamydia, gonorrhea, trichomonas, and syphilis can be cured with antibiotic medicine.   Genital herpes, hepatitis, and HIV can be treated, but not cured, with prescribed medicines. The medicines lessen symptoms.   Genital warts from HPV can be treated with medicine or by freezing, burning (electrocautery), or surgery. Warts may come back.   HPV cannot be cured with medicine or surgery. However, abnormal areas may be removed from the cervix, vagina, or vulva.   If your diagnosis is confirmed, your recent sexual partners need treatment. This is true even if they are symptom-free or have a negative culture or evaluation. They should not have sex until their health care providers say it is okay. HOW CAN I REDUCE MY RISK OF GETTING AN STD? Take these steps to reduce your risk of getting an STD:  Use latex condoms, dental dams, and water-soluble lubricants during sexual activity. Do not use petroleum jelly or oils.  Avoid having multiple sex partners.  Do not have sex with someone who has other sex partners.  Do not have sex with anyone you do not know or who is at high risk for an STD.  Avoid risky sex  practices that can break your skin.  Do not have sex if you have open sores on your mouth or skin.  Avoid drinking too much alcohol or taking illegal drugs. Alcohol and drugs can affect your judgment and put you in a vulnerable position.  Avoid engaging in oral and anal sex acts.  Get vaccinated for HPV and hepatitis. If you have not received these vaccines in the past, talk to your health care provider about whether one or both might be right for you.   If you are at risk of being infected with HIV, it is recommended that you take a prescription medicine daily to prevent HIV infection. This is called pre-exposure prophylaxis (PrEP). You are considered at risk if:  You are a man who has sex with other men (MSM).  You are a heterosexual man or woman and are sexually active with  more than one partner.  You take drugs by injection.  You are sexually active with a partner who has HIV.  Talk with your health care provider about whether you are at high risk of being infected with HIV. If you choose to begin PrEP, you should first be tested for HIV. You should then be tested every 3 months for as long as you are taking PrEP.  WHAT SHOULD I DO IF I THINK I HAVE AN STD?  See your health care provider.   Tell your sexual partner(s). They should be tested and treated for any STDs.  Do not have sex until your health care provider says it is okay. WHEN SHOULD I GET IMMEDIATE MEDICAL CARE? Contact your health care provider right away if:   You have severe abdominal pain.  You are a man and notice swelling or pain in your testicles.  You are a woman and notice swelling or pain in your vagina. Document Released: 09/08/2002 Document Revised: 06/23/2013 Document Reviewed: 01/06/2013 [facility] Patient Information 2015 [place], [place]. This information is not intended to replace advice given to you by your health care provider. Make sure you discuss any questions you have with your health care provider.  Hypertension Hypertension, commonly called high blood pressure, is when the force of blood pumping through your arteries is too strong. Your arteries are the blood vessels that carry blood from your heart throughout your body. A blood pressure reading consists of a higher number over a lower number, such as 110/72. The higher number (systolic) is the pressure inside your arteries when your heart pumps. The lower number (diastolic) is the pressure inside your arteries when your heart relaxes. Ideally you want your blood pressure below 120/80. Hypertension forces your heart to work harder to pump blood. Your arteries may become narrow or stiff. Having hypertension puts you at risk for heart disease, stroke, and other problems.  RISK FACTORS Some risk factors for high blood  pressure are controllable. Others are not.  Risk factors you cannot control include:   Race. You may be at higher risk if you are African American.  Age. Risk increases with age.  Gender. Men are at higher risk than women before age 44 years. After age 61, women are at higher risk than men. Risk factors you can control include:  Not getting enough exercise or physical activity.  Being overweight.  Getting too much fat, sugar, calories, or salt in your diet.  Drinking too much alcohol. SIGNS AND SYMPTOMS Hypertension does not usually cause signs or symptoms. Extremely high blood pressure (hypertensive crisis) may cause headache, anxiety, shortness of  breath, and nosebleed. DIAGNOSIS  To check if you have hypertension, your health care provider will measure your blood pressure while you are seated, with your arm held at the level of your heart. It should be measured at least twice using the same arm. Certain conditions can cause a difference in blood pressure between your right and left arms. A blood pressure reading that is higher than normal on one occasion does not mean that you need treatment. If one blood pressure reading is high, ask your health care provider about having it checked again. TREATMENT  Treating high blood pressure includes making lifestyle changes and possibly taking medicine. Living a healthy lifestyle can help lower high blood pressure. You may need to change some of your habits. Lifestyle changes may include:  Following the DASH diet. This diet is high in fruits, vegetables, and whole grains. It is low in salt, red meat, and added sugars.  Getting at least 2 hours of brisk physical activity every week.  Losing weight if necessary.  Not smoking.  Limiting alcoholic beverages.  Learning ways to reduce stress. If lifestyle changes are not enough to get your blood pressure under control, your health care provider may prescribe medicine. You may need to take  more than one. Work closely with your health care provider to understand the risks and benefits. HOME CARE INSTRUCTIONS  Have your blood pressure rechecked as directed by your health care provider.   Take medicines only as directed by your health care provider. Follow the directions carefully. Blood pressure medicines must be taken as prescribed. The medicine does not work as well when you skip doses. Skipping doses also puts you at risk for problems.   Do not smoke.   Monitor your blood pressure at home as directed by your health care provider. SEEK MEDICAL CARE IF:   You think you are having a reaction to medicines taken.  You have recurrent headaches or feel dizzy.  You have swelling in your ankles.  You have trouble with your vision. SEEK IMMEDIATE MEDICAL CARE IF:  You develop a severe headache or confusion.  You have unusual weakness, numbness, or feel faint.  You have severe chest or abdominal pain.  You vomit repeatedly.  You have trouble breathing. MAKE SURE YOU:   Understand these instructions.  Will watch your condition.  Will get help right away if you are not doing well or get worse. Document Released: 06/18/2005 Document Revised: 11/02/2013 Document Reviewed: 04/10/2013 [facility] Patient Information 2015 [place], [place]. This information is not intended to replace advice given to you by your health care provider. Make sure you discuss any questions you have with your health care provider.

## 2014-02-23 NOTE — ED Notes (Signed)
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  U/A showed blood in urine.  Burning with urination x 2 days. No orher s/s noted.

## 2014-02-23 NOTE — ED Provider Notes (Signed)
Medical screening examination/treatment/procedure(s) were performed by non-physician practitioner and as supervising physician I was immediately available for consultation/collaboration.     Geoffery Lyons, MD 02/23/14 1630

## 2014-02-24 LAB — URINE CULTURE: Colony Count: 6000

## 2014-02-24 LAB — GC/CHLAMYDIA PROBE AMP
CT Probe RNA: NEGATIVE
GC PROBE AMP APTIMA: NEGATIVE

## 2014-02-28 ENCOUNTER — Emergency Department (HOSPITAL_COMMUNITY)
Admission: EM | Admit: 2014-02-28 | Discharge: 2014-03-01 | Disposition: A | Discharge status: Home / Self Care | Payer: Commercial Managed Care - PPO | Attending: Emergency Medicine | Admitting: Emergency Medicine

## 2014-02-28 ENCOUNTER — Encounter (HOSPITAL_COMMUNITY): Payer: Self-pay | Admitting: Emergency Medicine

## 2014-02-28 DIAGNOSIS — N390 Urinary tract infection, site not specified: Secondary | ICD-10-CM | POA: Diagnosis not present

## 2014-02-28 DIAGNOSIS — Z792 Long term (current) use of antibiotics: Secondary | ICD-10-CM | POA: Diagnosis not present

## 2014-02-28 DIAGNOSIS — F17200 Nicotine dependence, unspecified, uncomplicated: Secondary | ICD-10-CM | POA: Diagnosis not present

## 2014-02-28 DIAGNOSIS — A5903 Trichomonal cystitis and urethritis: Secondary | ICD-10-CM | POA: Diagnosis not present

## 2014-02-28 DIAGNOSIS — R369 Urethral discharge, unspecified: Secondary | ICD-10-CM | POA: Diagnosis present

## 2014-02-28 NOTE — ED Notes (Signed)
Patient here with complaint of UTI symptoms and discharge. States that he was seen about a week ago for the same. Was treated with Rocephin, Keflex, and Azithromycin. States that symptoms are unchanged, "no better, no worse".

## 2014-03-01 LAB — URINALYSIS, ROUTINE W REFLEX MICROSCOPIC
Bilirubin Urine: NEGATIVE
Glucose, UA: NEGATIVE mg/dL
HGB URINE DIPSTICK: NEGATIVE
Ketones, ur: NEGATIVE mg/dL
NITRITE: NEGATIVE
Protein, ur: NEGATIVE mg/dL
SPECIFIC GRAVITY, URINE: 1.024 (ref 1.005–1.030)
UROBILINOGEN UA: 1 mg/dL (ref 0.0–1.0)
pH: 6 (ref 5.0–8.0)

## 2014-03-01 LAB — URINE MICROSCOPIC-ADD ON

## 2014-03-01 MED ORDER — METRONIDAZOLE 500 MG PO TABS: 500.0000 mg | ORAL_TABLET | Freq: Two times a day (BID) | ORAL | Status: AC

## 2014-03-01 NOTE — Discharge Instructions (Signed)
Take Flagyl as prescribed. Do not drink alcohol while taking this medication as it will make you violently ill vomiting. Do not engage in sexual intercourse until one week after completion of treatment. After this time, engage in unprotected sexual intercourse to prevent transmission of citrate transmitted infections. Followup with your primary care provider as needed.  Trichomoniasis Trichomoniasis is an infection caused by an organism called Trichomonas. The infection can affect both women and men. In women, the outer male genitalia and the vagina are affected. In men, the penis is mainly affected, but the prostate and other reproductive organs can also be involved. Trichomoniasis is a sexually transmitted infection (STI) and is most often passed to another person through sexual contact.  RISK FACTORS  Having unprotected sexual intercourse.  Having sexual intercourse with an infected partner. SIGNS AND SYMPTOMS  Symptoms of trichomoniasis in women include:  Abnormal gray-green frothy vaginal discharge.  Itching and irritation of the vagina.  Itching and irritation of the area outside the vagina. Symptoms of trichomoniasis in men include:   Penile discharge with or without pain.  Pain during urination. This results from inflammation of the urethra. DIAGNOSIS  Trichomoniasis may be found during a Pap test or physical exam. Your health care provider may use one of the following methods to help diagnose this infection:  Examining vaginal discharge under a microscope. For men, urethral discharge would be examined.  Testing the pH of the vagina with a test tape.  Using a vaginal swab test that checks for the Trichomonas organism. A test is available that provides results within a few minutes.  Doing a culture test for the organism. This is not usually needed. TREATMENT   You may be given medicine to fight the infection. Women should inform their health care provider if they could be  or are pregnant. Some medicines used to treat the infection should not be taken during pregnancy.  Your health care provider may recommend over-the-counter medicines or creams to decrease itching or irritation.  Your sexual partner will need to be treated if infected. HOME CARE INSTRUCTIONS   Take medicines only as directed by your health care provider.  Take over-the-counter medicine for itching or irritation as directed by your health care provider.  Do not have sexual intercourse while you have the infection.  Women should not douche or wear tampons while they have the infection.  Discuss your infection with your partner. Your partner may have gotten the infection from you, or you may have gotten it from your partner.  Have your sex partner get examined and treated if necessary.  Practice safe, informed, and protected sex.  See your health care provider for other STI testing. SEEK MEDICAL CARE IF:   You still have symptoms after you finish your medicine.  You develop abdominal pain.  You have pain when you urinate.  You have bleeding after sexual intercourse.  You develop a rash.  Your medicine makes you sick or makes you throw up (vomit). MAKE SURE YOU:  Understand these instructions.  Will watch your condition.  Will get help right away if you are not doing well or get worse. Document Released: 12/12/2000 Document Revised: 11/02/2013 Document Reviewed: 03/30/2013 [facility] Patient Information 2015 [place], [place]. This information is not intended to replace advice given to you by your health care provider. Make sure you discuss any questions you have with your health care provider.

## 2014-03-01 NOTE — ED Provider Notes (Signed)
CSN: [id number]     Arrival date & time 02/28/14  2337 History   First MD Initiated Contact with Patient 03/01/14 0001     Chief Complaint  Patient presents with  . Urinary Tract Infection  . Penile Discharge     (Consider location/radiation/quality/duration/timing/severity/associated sxs/prior Treatment) HPI Comments: 33 year old male presents to the emergency department for persistent dysuria and penile discharge. Patient states that symptoms have been persistent times one week. He states he was seen and evaluated on 02/23/2014. At this time, patient was treated with Rocephin and azithromycin for coverage of gonorrhea and Chlamydia. He was also discharged with course of Keflex for urinary tract infection. Patient states that his symptoms have not worsened, but had not improved. He denies associated fever, abdominal pain, nausea, vomiting, penile redness or swelling, scrotal redness or swelling, testicular tenderness, and pain with defecation. Patient endorses a history of unprotected sexual intercourse.  Patient is a 33 y.o. male presenting with urinary tract infection and penile discharge. The history is provided by the patient. No language interpreter was used.  Urinary Tract Infection  Penile Discharge    History reviewed. No pertinent past medical history. Past Surgical History  Procedure Laterality Date  . Hernia repair     Family History  Problem Relation Age of Onset  . Hypertension Mother   . Diabetes Mother    History  Substance Use Topics  . Smoking status: Current Every Day Smoker -- 1.00 packs/day    Types: Cigarettes  . Smokeless tobacco: Not on file  . Alcohol Use: Yes     Comment: occ    Review of Systems  Genitourinary: Positive for dysuria and discharge.  All other systems reviewed and are negative.    Allergies  Shellfish allergy  Home Medications   Prior to Admission medications   Medication Sig Start Date End Date Taking? Authorizing Provider   cephALEXin (KEFLEX) 500 MG capsule Take 1 capsule (500 mg total) by mouth 2 (two) times daily. 02/23/14  Yes Jennifer L Piepenbrink, PA-C  metroNIDAZOLE (FLAGYL) 500 MG tablet Take 1 tablet (500 mg total) by mouth 2 (two) times daily. 03/01/14   Antony Madura, PA-C   BP 139/73  Pulse 80  Temp(Src) 98.1 F (36.7 C) (Oral)  Resp 18  Ht 6' (1.829 m)  Wt 284 lb (128.822 kg)  BMI 38.51 kg/m2  SpO2 99%  Physical Exam  Nursing note and vitals reviewed. Constitutional: He is oriented to person, place, and time. He appears well-developed and well-nourished. No distress.  Nontoxic/nonseptic appearing  HENT:  Head: Normocephalic and atraumatic.  Eyes: Conjunctivae and EOM are normal. No scleral icterus.  Neck: Normal range of motion.  Pulmonary/Chest: Effort normal. No respiratory distress. He has no wheezes.  Chest expansion symmetric  Abdominal: Soft. There is no tenderness. There is no rebound and no guarding. Hernia confirmed negative in the right inguinal area and confirmed negative in the left inguinal area.  Abdomen soft and nontender. No peritoneal signs or masses.  Genitourinary: Testes normal and penis normal. Right testis shows no mass, no swelling and no tenderness. Left testis shows no mass, no swelling and no tenderness. Uncircumcised. No penile erythema or penile tenderness. No discharge found.  GU exam chaperoned by Gwenyth Allegra, RN. Exam is unremarkable.  Musculoskeletal: Normal range of motion.  Neurological: He is alert and oriented to person, place, and time. He exhibits normal muscle tone. Coordination normal.  Skin: Skin is warm and dry. No rash noted. He is not diaphoretic. No  erythema. No pallor.  Psychiatric: He has a normal mood and affect. His behavior is normal.    ED Course  Procedures (including critical care time) Labs Review Labs Reviewed  URINALYSIS, ROUTINE W REFLEX MICROSCOPIC - Abnormal; Notable for the following:    APPearance CLOUDY (*)    Leukocytes,  UA LARGE (*)    All other components within normal limits  URINE MICROSCOPIC-ADD ON - Abnormal; Notable for the following:    Squamous Epithelial / LPF FEW (*)    Bacteria, UA FEW (*)    All other components within normal limits    Results for orders placed during the hospital encounter of 02/23/14  URINE CULTURE      Result Value Ref Range   Specimen Description URINE, CLEAN CATCH     Special Requests NONE     Culture  Setup Time       Value: 02/23/2014 17:38     Performed at [facility] Count       Value: 6,000 COLONIES/ML     Performed at [facility]   Culture       Value: INSIGNIFICANT GROWTH     Performed at [facility]   Report Status 02/24/2014 FINAL    GC/CHLAMYDIA PROBE AMP      Result Value Ref Range   CT Probe RNA NEGATIVE  NEGATIVE   GC Probe RNA NEGATIVE  NEGATIVE  URINALYSIS, ROUTINE W REFLEX MICROSCOPIC      Result Value Ref Range   Color, Urine YELLOW  YELLOW   APPearance CLOUDY (*) CLEAR   Specific Gravity, Urine 1.014  1.005 - 1.030   pH 8.0  5.0 - 8.0   Glucose, UA NEGATIVE  NEGATIVE mg/dL   Hgb urine dipstick TRACE (*) NEGATIVE   Bilirubin Urine NEGATIVE  NEGATIVE   Ketones, ur NEGATIVE  NEGATIVE mg/dL   Protein, ur NEGATIVE  NEGATIVE mg/dL   Urobilinogen, UA 1.0  0.0 - 1.0 mg/dL   Nitrite NEGATIVE  NEGATIVE   Leukocytes, UA MODERATE (*) NEGATIVE  HIV ANTIBODY (ROUTINE TESTING)      Result Value Ref Range   HIV 1&2 Ab, 4th Generation NONREACTIVE  NONREACTIVE  RPR      Result Value Ref Range   RPR NON REAC  NON REAC  URINE MICROSCOPIC-ADD ON      Result Value Ref Range   Squamous Epithelial / LPF RARE  RARE   WBC, UA 21-50  <3 WBC/hpf   RBC / HPF 3-6  <3 RBC/hpf   Bacteria, UA RARE  RARE   Urine-Other TRICHOMONAS PRESENT     Imaging Review No results found.   EKG Interpretation None      MDM   Final diagnoses:  Trichomonal urethritis in male    33 year old male presents to the emergency  department for persistent penile discharge with dysuria. Patient was seen on 02/23/2014 and treated for gonorrhea and Chlamydia with Rocephin and azithromycin. Patient has also been taking Keflex for UTI with no improvement in symptoms. He denies any change in symptoms, just that they have persisted without improvement despite treatment.  Patient today as well and nontoxic appearing hemodynamically stable, and afebrile. Physical exam today without abdominal tenderness to palpation. No peritoneal signs. Chaperoned GU exam also unremarkable. Labs from previous encounter reviewed which show urinalysis being positive for Trichomonas. Suspect that this is why patient's symptoms have persisted since last seen, as trichomonas also present today. Patient will be treated today with  course of Flagyl. Have advised the patient refrain from sexual intercourse until one week after treatment completion. Primary care followup advised and return precautions provided. Patient agreeable to plan with no unaddressed concerns.   Filed Vitals:   02/28/14 2346 03/01/14 0029  BP: 143/75 139/73  Pulse: 85 80  Temp: 98.1 F (36.7 C)   TempSrc: Oral   Resp: 16 18  Height: 6' (1.829 m)   Weight: 284 lb (128.822 kg)   SpO2: 98% 99%      Antony Madura, PA-C 03/01/14 [phone]

## 2014-03-01 NOTE — ED Provider Notes (Signed)
Medical screening examination/treatment/procedure(s) were performed by non-physician practitioner and as supervising physician I was immediately available for consultation/collaboration.    Chantalle Defilippo D Renlee Floor, MD 03/01/14 0052 

## 2016-03-10 ENCOUNTER — Encounter (HOSPITAL_COMMUNITY): Payer: Self-pay | Admitting: Emergency Medicine

## 2016-03-10 ENCOUNTER — Emergency Department (HOSPITAL_COMMUNITY)
Admission: EM | Admit: 2016-03-10 | Discharge: 2016-03-10 | Disposition: A | Discharge status: Home / Self Care | Payer: Commercial Managed Care - PPO | Attending: Physician Assistant | Admitting: Physician Assistant

## 2016-03-10 DIAGNOSIS — L03116 Cellulitis of left lower limb: Secondary | ICD-10-CM

## 2016-03-10 DIAGNOSIS — W57XXXA Bitten or stung by nonvenomous insect and other nonvenomous arthropods, initial encounter: Secondary | ICD-10-CM

## 2016-03-10 DIAGNOSIS — F17210 Nicotine dependence, cigarettes, uncomplicated: Secondary | ICD-10-CM | POA: Insufficient documentation

## 2016-03-10 LAB — I-STAT CHEM 8, ED
BUN: 10 mg/dL (ref 6–20)
CHLORIDE: 101 mmol/L (ref 101–111)
Calcium, Ion: 1.19 mmol/L (ref 1.15–1.40)
Creatinine, Ser: 1.1 mg/dL (ref 0.61–1.24)
Glucose, Bld: 103 mg/dL — ABNORMAL HIGH (ref 65–99)
HCT: 47 % (ref 39.0–52.0)
Hemoglobin: 16 g/dL (ref 13.0–17.0)
POTASSIUM: 3.4 mmol/L — AB (ref 3.5–5.1)
SODIUM: 141 mmol/L (ref 135–145)
TCO2: 31 mmol/L (ref 0–100)

## 2016-03-10 LAB — CBC WITH DIFFERENTIAL/PLATELET
BASOS ABS: 0 10*3/uL (ref 0.0–0.1)
Basophils Relative: 0 %
EOS ABS: 0.3 10*3/uL (ref 0.0–0.7)
EOS PCT: 2 %
HCT: 45.7 % (ref 39.0–52.0)
HEMOGLOBIN: 15.5 g/dL (ref 13.0–17.0)
LYMPHS PCT: 31 %
Lymphs Abs: 4.4 10*3/uL — ABNORMAL HIGH (ref 0.7–4.0)
MCH: 30.8 pg (ref 26.0–34.0)
MCHC: 33.9 g/dL (ref 30.0–36.0)
MCV: 90.9 fL (ref 78.0–100.0)
Monocytes Absolute: 0.9 10*3/uL (ref 0.1–1.0)
Monocytes Relative: 6 %
NEUTROS PCT: 61 %
Neutro Abs: 8.8 10*3/uL — ABNORMAL HIGH (ref 1.7–7.7)
PLATELETS: 259 10*3/uL (ref 150–400)
RBC: 5.03 MIL/uL (ref 4.22–5.81)
RDW: 13.6 % (ref 11.5–15.5)
WBC: 14.4 10*3/uL — AB (ref 4.0–10.5)

## 2016-03-10 MED ORDER — IBUPROFEN 400 MG PO TABS
600.0000 mg | ORAL_TABLET | Freq: Once | ORAL | Status: AC
  Administered 2016-03-10: 600 mg via ORAL
  Filled 2016-03-10: qty 1

## 2016-03-10 MED ORDER — SULFAMETHOXAZOLE-TRIMETHOPRIM 800-160 MG PO TABS: 1.0000 | ORAL_TABLET | Freq: Two times a day (BID) | ORAL | 0 refills | Status: AC

## 2016-03-10 NOTE — ED Provider Notes (Signed)
[facility]-EMERGENCY DEPT Provider Note   CSN: [id number] Arrival date & time: 03/10/16  2120     History   Chief Complaint Chief Complaint  Patient presents with  . Insect Bite    HPI Elijah Walsh is a 35 y.o. male with No significant past medical history who presents to the ED today complaining of a insect bite to his left lower extremity. Patient states that on Monday he noticed that he had a bite mark on his left anterior shin. Since then he has noticed that the area has become more swollen, red and slightly tender to the touch. He has tried applying peroxide to the area without relief. He has not tried taking any oral medications. He denies any fevers, chills, vomiting, diarrhea. Patient did not actually see what bit him. He denies any IV drug use. No history of diabetes.  HPI  History reviewed. No pertinent past medical history.  There are no active problems to display for this patient.   Past Surgical History:  Procedure Laterality Date  . HERNIA REPAIR         Home Medications    Prior to Admission medications   Medication Sig Start Date End Date Taking? Authorizing Provider  cephALEXin (KEFLEX) 500 MG capsule Take 1 capsule (500 mg total) by mouth 2 (two) times daily. 02/23/14   Jennifer Piepenbrink, PA-C  metroNIDAZOLE (FLAGYL) 500 MG tablet Take 1 tablet (500 mg total) by mouth 2 (two) times daily. 03/01/14   Antony Madura, PA-C  sulfamethoxazole-trimethoprim (BACTRIM DS,SEPTRA DS) 800-160 MG tablet Take 1 tablet by mouth 2 (two) times daily. 03/10/16 03/17/16  Ronaldo Crilly Tripp Marque Bango, PA-C    Family History Family History  Problem Relation Age of Onset  . Hypertension Mother   . Diabetes Mother     Social History Social History  Substance Use Topics  . Smoking status: Current Every Day Smoker    Packs/day: 1.00    Types: Cigarettes  . Smokeless tobacco: Never Used  . Alcohol use Yes     Comment: occ     Allergies   Shellfish allergy   Review of  Systems Review of Systems  All other systems reviewed and are negative.    Physical Exam Updated Vital Signs BP (!) 158/101 (BP Location: Left Arm)   Pulse 105   Temp 99 F (37.2 C) (Oral)   Resp 18   SpO2 99%   Physical Exam  Constitutional: He is oriented to person, place, and time. He appears well-developed and well-nourished. No distress.  HENT:  Head: Normocephalic and atraumatic.  Eyes: Conjunctivae are normal. Right eye exhibits no discharge. Left eye exhibits no discharge. No scleral icterus.  Cardiovascular: Normal rate.   Pulmonary/Chest: Effort normal.  Neurological: He is alert and oriented to person, place, and time. Coordination normal.  Skin: Skin is warm and dry. No rash noted. He is not diaphoretic. No pallor.  Small, 0.5 cm scab located on left anterior shin with mild overlying warmth and minimal tenderness to palpation. No fluctuance or induration. No active drainage.  Psychiatric: He has a normal mood and affect. His behavior is normal.  Nursing note and vitals reviewed.    ED Treatments / Results  Labs (all labs ordered are listed, but only abnormal results are displayed) Labs Reviewed  CBC WITH DIFFERENTIAL/PLATELET - Abnormal; Notable for the following:       Result Value   WBC 14.4 (*)    Neutro Abs 8.8 (*)    Lymphs Abs 4.4 (*)  All other components within normal limits  I-STAT CHEM 8, ED - Abnormal; Notable for the following:    Potassium 3.4 (*)    Glucose, Bld 103 (*)    All other components within normal limits    EKG  EKG Interpretation None       Radiology No results found.  Procedures Procedures (including critical care time)  Medications Ordered in ED Medications  ibuprofen (ADVIL,MOTRIN) tablet 600 mg (600 mg Oral Given 03/10/16 2226)     Initial Impression / Assessment and Plan / ED Course  I have reviewed the triage vital signs and the nursing notes.  Pertinent labs & imaging results that were available during my  care of the patient were reviewed by me and considered in my medical decision making (see chart for details).  Clinical Course    Otherwise healthy 35 year old male presents to the ED today with presumed insect bite to left anterior shin that occurred 5 days ago. On exam there is a small scab located on his left lower extremity with mild overlying warmth. No erythema, fluctuance or induration. No sign of abscess. Patient is mildly tachycardic at 105. However, overall patient is nontoxic and nonseptic appearing. We'll try course of oral antibiotics with strict return precautions. Wound was outlined with sterile marker. He will follow up next week for a wound recheck and return sooner if he exhibits his fevers, chills, increased redness or swelling around the wound.  Patient was discussed with and seen by Dr. Corlis LeakMacKuen who agrees with the treatment plan.    Final Clinical Impressions(s) / ED Diagnoses   Final diagnoses:  Insect bite  Cellulitis of left lower extremity    New Prescriptions New Prescriptions   SULFAMETHOXAZOLE-TRIMETHOPRIM (BACTRIM DS,SEPTRA DS) 800-160 MG TABLET    Take 1 tablet by mouth 2 (two) times daily.     Lester KinsmanSamantha Tripp [place], PA-C 03/10/16 2231    Courteney Randall AnLyn Mackuen, MD 03/10/16 2336

## 2016-03-10 NOTE — ED Notes (Signed)
Pt A&OX4, ambulatory at d/c with steady gait NAD 

## 2016-03-10 NOTE — ED Triage Notes (Signed)
Patient states that he was bit by a spider in his truck on Monday, now with bite on left shin, with area of redness, warmth and pain around the bite itself.  Sometimes it does itch.

## 2016-03-10 NOTE — ED Notes (Signed)
Area of erythema outlined with skin maker and pt instructed to return here to ER or to urgent care for wound recheck in 3 days

## 2016-03-10 NOTE — Discharge Instructions (Signed)
Take antibiotics as prescribed. Follow up with urgent care in 3 days for a wound re-check. Take ibuprofen as needed for pain. Return to the ED if you experience severe worsening of your symptoms, increased pain or swelling, fevers, vomiting, diarrhea. If the redness extends past the outlined area, please return to the ED as well.

## 2016-03-10 NOTE — ED Notes (Signed)
Dr. Corlis Leakmackuen to see patient in triage

## 2017-10-20 ENCOUNTER — Emergency Department (HOSPITAL_COMMUNITY)
Admission: EM | Admit: 2017-10-20 | Discharge: 2017-10-20 | Disposition: A | Discharge status: Home / Self Care | Payer: Commercial Managed Care - PPO | Attending: Emergency Medicine | Admitting: Emergency Medicine

## 2017-10-20 ENCOUNTER — Other Ambulatory Visit: Payer: Self-pay

## 2017-10-20 ENCOUNTER — Encounter (HOSPITAL_COMMUNITY): Payer: Self-pay | Admitting: Emergency Medicine

## 2017-10-20 DIAGNOSIS — F17210 Nicotine dependence, cigarettes, uncomplicated: Secondary | ICD-10-CM | POA: Insufficient documentation

## 2017-10-20 DIAGNOSIS — R030 Elevated blood-pressure reading, without diagnosis of hypertension: Secondary | ICD-10-CM | POA: Insufficient documentation

## 2017-10-20 DIAGNOSIS — K029 Dental caries, unspecified: Secondary | ICD-10-CM | POA: Insufficient documentation

## 2017-10-20 DIAGNOSIS — K0889 Other specified disorders of teeth and supporting structures: Secondary | ICD-10-CM

## 2017-10-20 DIAGNOSIS — Z79899 Other long term (current) drug therapy: Secondary | ICD-10-CM | POA: Insufficient documentation

## 2017-10-20 MED ORDER — PENICILLIN V POTASSIUM 500 MG PO TABS: 500.0000 mg | ORAL_TABLET | Freq: Four times a day (QID) | ORAL | 0 refills | Status: AC

## 2017-10-20 NOTE — ED Triage Notes (Signed)
Patient presents to ED for assessment of right upper dental pain with swelling and jaw aching.  Dental pain started 1 week ago, swelling started this morning.   Denies difficulty swallowing, denies difficulty breathing, c/o difficulty eating.

## 2017-10-20 NOTE — Discharge Instructions (Signed)
You have a dental infection. It is very important that you get evaluated by a dentist as soon as possible. Call tomorrow to schedule an appointment. Ibuprofen as needed for pain. Take your full course of antibiotics. Read the instructions below.  Eat a soft or liquid diet and rinse your mouth out after meals with warm water. You should see a dentist or return here at once if you have increased swelling, increased pain or uncontrolled bleeding from the site of your injury.  SEEK MEDICAL CARE IF:  You have bleeding which starts, continues, or gets worse.  You have a fever >101 If you are unable to open your mouth New or worsening symptoms develop, any additional concerns.    Your blood pressure was elevated today.  Please see a primary care doctor for blood pressure recheck in the next week or 2.  If you do not have one, please see the information below.  To find a primary care or specialty doctor please call [phone] or [phone] to access "[place] Find a Doctor Service."  You may also go on the [facility] website at [email or web address]  There are also multiple [facility] and [facility] practices throughout the [place] that are frequently accepting new patients. You may find a clinic that is close to your home and contact them.  [facility] - [address] [id number] [phone]  [facility] in [place] (also locations in [place] and [place]) - [address] [place] [phone]  [facility] - [address] [place] [phone]

## 2017-10-20 NOTE — ED Provider Notes (Signed)
[facility] Provider Note   CSN: [id number] Arrival date & time: 10/20/17  1153     History   Chief Complaint Chief Complaint  Patient presents with  . Dental Pain    HPI Elijah Walsh is a 37 y.o. male.  The history is provided by the patient and medical records. No language interpreter was used.  Dental Pain      Elijah Walsh is a 37 y.o. male who presents to the Emergency Department complaining of persistent, gradually worsening, right-sided dental pain beginning one week ago.  Patient states that he chipped his tooth about 1 week ago.  He is having aching pain at the tooth, but improved with Goody powder.  This morning, he felt a throbbing pain which was different.  He noticed some swelling around the tooth as well.  Not currently followed by dentistry.  Worse with chewing at the site. Pt denies fever, chills, difficulty breathing, difficulty swallowing.   History reviewed. No pertinent past medical history.  There are no active problems to display for this patient.   Past Surgical History:  Procedure Laterality Date  . HERNIA REPAIR          Home Medications    Prior to Admission medications   Medication Sig Start Date End Date Taking? Authorizing Provider  cephALEXin (KEFLEX) 500 MG capsule Take 1 capsule (500 mg total) by mouth 2 (two) times daily. 02/23/14   Piepenbrink, Victorino DikeJennifer, PA-C  metroNIDAZOLE (FLAGYL) 500 MG tablet Take 1 tablet (500 mg total) by mouth 2 (two) times daily. 03/01/14   Antony MaduraHumes, Kelly, PA-C  penicillin v potassium (VEETID) 500 MG tablet Take 1 tablet (500 mg total) by mouth 4 (four) times daily for 7 days. 10/20/17 10/27/17  Tekisha Darcey, Chase PicketJaime Pilcher, PA-C    Family History Family History  Problem Relation Age of Onset  . Hypertension Mother   . Diabetes Mother     Social History Social History   Tobacco Use  . Smoking status: Current Every Day Smoker    Packs/day: 1.50    Types: Cigarettes  .  Smokeless tobacco: Never Used  Substance Use Topics  . Alcohol use: Yes    Comment: occ  . Drug use: No     Allergies   Shellfish allergy   Review of Systems Review of Systems  Constitutional: Negative for chills and fever.  HENT: Positive for dental problem. Negative for sore throat and trouble swallowing.   Respiratory: Negative for cough and shortness of breath.      Physical Exam Updated Vital Signs BP (!) 164/100 (BP Location: Right Arm)   Pulse 85   Temp 98.1 F (36.7 C) (Oral)   Resp 16   SpO2 100%   Physical Exam  Constitutional: He appears well-developed and well-nourished. No distress.  HENT:  Head: Normocephalic and atraumatic.  Mouth/Throat:    Dental cavities and poor oral dentition noted. Pain along tooth as depicted in image. No abscess noted. Midline uvula. No trismus. OP moist and clear. No oropharyngeal erythema or edema. Neck supple with no tenderness.  Neck: Neck supple.  Cardiovascular: Normal rate, regular rhythm and normal heart sounds.  No murmur heard. Pulmonary/Chest: Effort normal and breath sounds normal. No respiratory distress. He has no wheezes. He has no rales.  Musculoskeletal: Normal range of motion.  Neurological: He is alert.  Skin: Skin is warm and dry.  Nursing note and vitals reviewed.    ED Treatments / Results  Labs (all  labs ordered are listed, but only abnormal results are displayed) Labs Reviewed - No data to display  EKG None  Radiology No results found.  Procedures Procedures (including critical care time)  Medications Ordered in ED Medications - No data to display   Initial Impression / Assessment and Plan / ED Course  I have reviewed the triage vital signs and the nursing notes.  Pertinent labs & imaging results that were available during my care of the patient were reviewed by me and considered in my medical decision making (see chart for details).    Elijah Walsh is a 37 y.o. male who presents  to ED for dental pain. No abscess requiring immediate incision and drainage. Patient is afebrile, non toxic appearing, and swallowing secretions well. Exam not concerning for Ludwig's angina or pharyngeal abscess. Will treat with PenVK. I provided dental resource guide and stressed the importance of dental follow up for ultimate management of dental pain.  Pressure elevated in the emergency department today.  No history of hypertension.  Asymptomatic.  Recommend he follow-up with PCP for blood pressure check.  Patient voices understanding and is agreeable to plan.    Final Clinical Impressions(s) / ED Diagnoses   Final diagnoses:  Pain, dental  Elevated blood pressure reading    ED Discharge Orders        Ordered    penicillin v potassium (VEETID) 500 MG tablet  4 times daily     10/20/17 1335       Laisha Rau, Chase Picket, PA-C 10/20/17 1407    Nira Conn, MD 10/20/17 2039

## 2017-10-20 NOTE — ED Notes (Signed)
Declined W/C at D/C and was escorted to lobby by RN. 

## 2020-09-04 ENCOUNTER — Encounter (HOSPITAL_COMMUNITY): Payer: Self-pay | Admitting: Emergency Medicine

## 2020-09-04 ENCOUNTER — Emergency Department (HOSPITAL_COMMUNITY): Payer: Self-pay

## 2020-09-04 ENCOUNTER — Emergency Department (HOSPITAL_COMMUNITY)
Admission: EM | Admit: 2020-09-04 | Discharge: 2020-09-04 | Disposition: A | Discharge status: Home / Self Care | Payer: Self-pay | Attending: Emergency Medicine | Admitting: Emergency Medicine

## 2020-09-04 ENCOUNTER — Other Ambulatory Visit: Payer: Self-pay

## 2020-09-04 DIAGNOSIS — Z23 Encounter for immunization: Secondary | ICD-10-CM | POA: Insufficient documentation

## 2020-09-04 DIAGNOSIS — T07XXXA Unspecified multiple injuries, initial encounter: Secondary | ICD-10-CM

## 2020-09-04 DIAGNOSIS — Y92410 Unspecified street and highway as the place of occurrence of the external cause: Secondary | ICD-10-CM | POA: Insufficient documentation

## 2020-09-04 DIAGNOSIS — S40212A Abrasion of left shoulder, initial encounter: Secondary | ICD-10-CM | POA: Insufficient documentation

## 2020-09-04 DIAGNOSIS — S2242XA Multiple fractures of ribs, left side, initial encounter for closed fracture: Secondary | ICD-10-CM | POA: Insufficient documentation

## 2020-09-04 DIAGNOSIS — S0031XA Abrasion of nose, initial encounter: Secondary | ICD-10-CM | POA: Insufficient documentation

## 2020-09-04 DIAGNOSIS — S50812A Abrasion of left forearm, initial encounter: Secondary | ICD-10-CM | POA: Insufficient documentation

## 2020-09-04 DIAGNOSIS — S50811A Abrasion of right forearm, initial encounter: Secondary | ICD-10-CM | POA: Insufficient documentation

## 2020-09-04 DIAGNOSIS — F17210 Nicotine dependence, cigarettes, uncomplicated: Secondary | ICD-10-CM | POA: Insufficient documentation

## 2020-09-04 MED ORDER — OXYCODONE HCL 5 MG PO TABS: 5.0000 mg | ORAL_TABLET | Freq: Four times a day (QID) | ORAL | 0 refills | Status: AC | PRN

## 2020-09-04 MED ORDER — TETANUS-DIPHTH-ACELL PERTUSSIS 5-2.5-18.5 LF-MCG/0.5 IM SUSY
0.5000 mL | PREFILLED_SYRINGE | Freq: Once | INTRAMUSCULAR | Status: AC
  Administered 2020-09-04: 0.5 mL via INTRAMUSCULAR
  Filled 2020-09-04: qty 0.5

## 2020-09-04 MED ORDER — OXYCODONE-ACETAMINOPHEN 5-325 MG PO TABS
1.0000 | ORAL_TABLET | Freq: Once | ORAL | Status: AC
  Administered 2020-09-04: 1 via ORAL
  Filled 2020-09-04: qty 1

## 2020-09-04 NOTE — ED Notes (Signed)
Patient Alert and oriented to baseline. Stable and ambulatory to baseline. Patient verbalized understanding of the discharge instructions.  Patient belongings were taken by the patient.   

## 2020-09-04 NOTE — ED Notes (Signed)
Spoke with PA at this time there is no need for IV
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
## 2020-09-04 NOTE — ED Notes (Signed)
Dressings changed and wrapped.

## 2021-09-09 ENCOUNTER — Other Ambulatory Visit: Payer: Self-pay

## 2021-09-09 ENCOUNTER — Encounter (HOSPITAL_COMMUNITY): Payer: Self-pay

## 2021-09-09 ENCOUNTER — Emergency Department (HOSPITAL_COMMUNITY)
Admission: EM | Admit: 2021-09-09 | Discharge: 2021-09-10 | Disposition: A | Discharge status: Home / Self Care | Payer: Self-pay | Attending: Emergency Medicine | Admitting: Emergency Medicine

## 2021-09-09 DIAGNOSIS — L03011 Cellulitis of right finger: Secondary | ICD-10-CM | POA: Insufficient documentation

## 2021-09-09 NOTE — ED Provider Triage Note (Signed)
Emergency Medicine Provider Triage Evaluation Note ? ?Elijah Walsh , a 41 y.o. male  was evaluated in triage.  Pt complains of finger infection.  Patient reports that he has had swelling to his right middle finger.  Swelling has been there over the last 2 weeks.  Patient able to express purulent discharge near the nail fold last week.  Since then swelling has gradually resumed.  Patient complains of pain to the affected site.  Denies any numbness or weakness. ? ?Patient is right-hand dominant. ? ?Review of Systems  ?Positive: Finger swelling ?Negative: Fever, chills, numbness, weakness ? ?Physical Exam  ?BP (!) 171/104 (BP Location: Right Arm)   Pulse (!) 103   Temp 98.2 ?F (36.8 ?C) (Oral)   Resp 18   Ht 6' (1.829 m)   Wt (!) 140.6 kg   SpO2 100%   BMI 42.04 kg/m?  ?Gen:   Awake, no distress   ?Resp:  Normal effort  ?MSK:   Moves extremities without difficulty  ?Other:  Patient has swelling and tenderness adjacent to nail fold.  Full range of motion to affected digit. ? ?Medical Decision Making  ?Medically screening exam initiated at 10:44 PM.  Appropriate orders placed.  Kirtis A Klempner was informed that the remainder of the evaluation will be completed by another provider, this initial triage assessment does not replace that evaluation, and the importance of remaining in the ED until their evaluation is complete. ? ? ?  ?Loni Beckwith, PA-C ?09/09/21 2246 ? ?

## 2021-09-09 NOTE — ED Triage Notes (Signed)
Reports biting his nails and cut right 3rd finger 2 weeks ago. Since then distal part has began swelling and draining pus.  ?

## 2021-09-10 MED ORDER — CEPHALEXIN 250 MG PO CAPS
500.0000 mg | ORAL_CAPSULE | Freq: Once | ORAL | Status: AC
  Administered 2021-09-10: 500 mg via ORAL
  Filled 2021-09-10: qty 2

## 2021-09-10 MED ORDER — CEPHALEXIN 500 MG PO CAPS: 500.0000 mg | ORAL_CAPSULE | Freq: Two times a day (BID) | ORAL | 0 refills | Status: AC

## 2021-09-10 MED ORDER — LIDOCAINE HCL (PF) 1 % IJ SOLN
30.0000 mL | Freq: Once | INTRAMUSCULAR | Status: AC
  Administered 2021-09-10: 30 mL
  Filled 2021-09-10: qty 30

## 2021-09-10 NOTE — Discharge Instructions (Addendum)
Take the antibiotics as prescribed.  Return for new or worsening symptoms.  Continue to do warm soaks. ?

## 2021-09-10 NOTE — ED Provider Notes (Signed)
?[place] DEPT ?[facility] Emergency Department ?Provider Note ?MRN:  [id number]  ?Arrival date & time: 09/10/21    ? ?Chief Complaint   ?Finger Injury ?  ?History of Present Illness   ?Elijah Walsh is a 41 y.o. year-old male presents to the ED with chief complaint of finger pain.  He states that he has an infection around his right middle finger nail.  He states that he bites his nails.  He states that his girlfriend was able to express some pus about a week ago, but it has re accumulated and is painful.  He denies any fever.  He has not taken anything for his symptoms. ? ? ? ? ?Review of Systems  ?Pertinent review of systems noted in HPI.  ? ? ?Physical Exam  ? ?Vitals:  ? 09/09/21 2215  ?BP: (!) 171/104  ?Pulse: (!) 103  ?Resp: 18  ?Temp: 98.2 ?F (36.8 ?C)  ?SpO2: 100%  ?  ?CONSTITUTIONAL:  well-appearing, NAD ?NEURO:  Alert and oriented x 3,  ?EYES:  eyes equal and reactive ?ENT/NECK:  Supple, no stridor  ?CARDIO:  appears well-perfused  ?PULM:  No respiratory distress,  ?GI/GU:  non-distended,  ?MSK/SPINE:  No gross deformities, no edema, moves all extremities  ?SKIN:  no rash, atraumatic, paronychia of right middle finger on the ulnar aspect ? ? ?*Additional and/or pertinent findings included in MDM below ? ?Diagnostic and Interventional Summary  ? ? ?Labs Reviewed - No data to display  ?No orders to display  ?  ?Medications  ?cephALEXin (KEFLEX) capsule 500 mg (has no administration in time range)  ?lidocaine (PF) (XYLOCAINE) 1 % injection 30 mL (30 mLs Infiltration Given 09/10/21 0119)  ?  ? ?Procedures  /  Critical Care ?Marland Kitchen.Incision and Drainage ? ?Date/Time: 09/10/2021 1:20 AM ?Performed by: Montine Circle, PA-C ?Authorized by: Montine Circle, PA-C  ? ?Consent:  ?  Consent obtained:  Verbal ?  Consent given by:  Patient ?  Risks, benefits, and alternatives were discussed: yes   ?  Risks discussed:  Bleeding, incomplete drainage, pain and infection ?  Alternatives discussed:  No  treatment ?Universal protocol:  ?  Procedure explained and questions answered to patient or proxy's satisfaction: yes   ?  Relevant documents present and verified: yes   ?  Test results available : yes   ?  Imaging studies available: yes   ?  Required blood products, implants, devices, and special equipment available: yes   ?  Site/side marked: yes   ?  Immediately prior to procedure, a time out was called: yes   ?  Patient identity confirmed:  Verbally with patient ?Location:  ?  Type:  Abscess ?  Location:  Upper extremity ?  Upper extremity location:  Finger ?  Finger location:  R long finger ?Pre-procedure details:  ?  Skin preparation:  Povidone-iodine ?Anesthesia:  ?  Anesthesia method:  Nerve block ?  Block location:  Digital ring ?  Block needle gauge:  27 G ?  Block anesthetic:  Lidocaine 1% w/o epi ?  Block technique:  Digital ring ?Procedure type:  ?  Complexity:  Simple ?Procedure details:  ?  Incision types:  Stab incision (adjacent to nail) ?  Wound treatment:  Wound left open ?Post-procedure details:  ?  Procedure completion:  Tolerated well, no immediate complications ? ?ED Course and Medical Decision Making  ?I have reviewed the triage vital signs, the nursing notes, and pertinent available records from the EMR. ? ?Complexity of  Problems Addressed: ?Low Complexity: Acute, uncomplicated illness or injury requiring no diagnostic workup ?Comorbidities affecting this illness/injury include: ?None ?Social Determinants Affecting Care: ?Complexity of care is increased due to: No clinically significant social determinants affecting this chief complaint.. ? ? ?ED Course: ?After considering the following differential, paronychia, felon, cellulitis, I ordered lidocaine for bedside I&D. ?. ? ?  ? ?Consultants: ?No consultations were needed in caring for this patient. ? ?Treatment and Plan: ?Treat with keflex.  I and D didn't drain much.  Continue warm compresses. ? ?Emergency department workup does not suggest  an emergent condition requiring admission or immediate intervention beyond  what has been performed at this time. The patient is safe for discharge and has  been instructed to return immediately for worsening symptoms, change in  symptoms or any other concerns ? ? ? ?Final Clinical Impressions(s) / ED Diagnoses  ? ?  ICD-10-CM   ?1. Paronychia of finger, right  L03.011   ?  ?  ?ED Discharge Orders   ? ?      Ordered  ?  cephALEXin (KEFLEX) 500 MG capsule  2 times daily       ? 09/10/21 0119  ? ?  ?  ? ?  ?  ? ? ?Discharge Instructions Discussed with and Provided to Patient:  ? ? ? ?Discharge Instructions   ? ?  ?Take the antibiotics as prescribed.  Return for new or worsening symptoms.  Continue to do warm soaks. ? ? ? ? ?  ?Montine Circle, PA-C ?09/10/21 0122 ? ?  ?Merrily Pew, MD ?09/10/21 [phone] ? ?

## 2021-09-16 ENCOUNTER — Other Ambulatory Visit: Payer: Self-pay

## 2021-09-16 ENCOUNTER — Emergency Department (HOSPITAL_COMMUNITY)
Admission: EM | Admit: 2021-09-16 | Discharge: 2021-09-16 | Disposition: A | Discharge status: Home / Self Care | Payer: Medicaid Other | Attending: Emergency Medicine | Admitting: Emergency Medicine

## 2021-09-16 DIAGNOSIS — R000 Tachycardia, unspecified: Secondary | ICD-10-CM | POA: Insufficient documentation

## 2021-09-16 DIAGNOSIS — L03011 Cellulitis of right finger: Secondary | ICD-10-CM | POA: Insufficient documentation

## 2021-09-16 MED ORDER — LIDOCAINE HCL (PF) 1 % IJ SOLN
5.0000 mL | Freq: Once | INTRAMUSCULAR | Status: AC
  Administered 2021-09-16: 5 mL
  Filled 2021-09-16: qty 5

## 2021-09-16 MED ORDER — DOXYCYCLINE HYCLATE 100 MG PO TABS
100.0000 mg | ORAL_TABLET | Freq: Once | ORAL | Status: AC
  Administered 2021-09-16: 100 mg via ORAL
  Filled 2021-09-16: qty 1

## 2021-09-16 MED ORDER — DOXYCYCLINE HYCLATE 100 MG PO CAPS: 100.0000 mg | ORAL_CAPSULE | Freq: Two times a day (BID) | ORAL | 0 refills | Status: AC

## 2021-09-16 NOTE — ED Provider Notes (Signed)
?[facility] EMERGENCY DEPARTMENT ?Provider Note ? ? ?CSN: [id number] ?Arrival date & time: 09/16/21  1426 ? ?  ? ?History ? ?Chief Complaint  ?Patient presents with  ? Finger Injury  ? ? ?Elijah Walsh is a 41 y.o. male who presents to the ED today with complaint of gradual onset, constant, worsening, R middle finger swelling and pain x 1.5 weeks. Per chart review pt was seen in the ED on 03/12 for same - had I&D performed s/2 paronychia. Does appear only a very small amount of purulent drainage was expressed. Pt was discharged home and placed on keflex which he has been taking without relief. He has 1 pill left however reports it does not seem to be helping and infact the swelling has gotten worse. He has been taking Tylenol for pain without relief. Denies fevers or chills.  ? ?The history is provided by the patient and medical records.  ? ?  ? ?Home Medications ?Prior to Admission medications   ?Medication Sig Start Date End Date Taking? Authorizing Provider  ?doxycycline (VIBRAMYCIN) 100 MG capsule Take 1 capsule (100 mg total) by mouth 2 (two) times daily for 7 days. 09/16/21 09/23/21 Yes Tanda Rockers, PA-C  ?cephALEXin (KEFLEX) 500 MG capsule Take 1 capsule (500 mg total) by mouth 2 (two) times daily. 09/10/21   Roxy Horseman, PA-C  ?metroNIDAZOLE (FLAGYL) 500 MG tablet Take 1 tablet (500 mg total) by mouth 2 (two) times daily. 03/01/14   Antony Madura, PA-C  ?   ? ?Allergies    ?Shellfish allergy   ? ?Review of Systems   ?Review of Systems  ?Constitutional:  Negative for chills and fever.  ?Musculoskeletal:  Positive for arthralgias and joint swelling.  ?Skin:  Positive for color change.  ?All other systems reviewed and are negative. ? ?Physical Exam ?Updated Vital Signs ?BP (!) 177/108 (BP Location: Left Arm)   Pulse (!) 109   Temp 98.4 ?F (36.9 ?C)   Resp 14   SpO2 98%  ?Physical Exam ?Vitals and nursing note reviewed.  ?Constitutional:   ?   Appearance: He is not ill-appearing.  ?HENT:   ?   Head: Normocephalic and atraumatic.  ?Eyes:  ?   Conjunctiva/sclera: Conjunctivae normal.  ?Cardiovascular:  ?   Rate and Rhythm: Normal rate and regular rhythm.  ?Pulmonary:  ?   Effort: Pulmonary effort is normal.  ?   Breath sounds: Normal breath sounds.  ?Musculoskeletal:  ?   Comments: See photos below. Large paronychia noted to R middle finger along medial aspect with associated swelling along distal pad. ROM intact to MCP, PIP, and DIP joint. Cap refill < 2 seconds. 2+ radial pulse.   ?Skin: ?   General: Skin is warm and dry.  ?   Coloration: Skin is not jaundiced.  ?Neurological:  ?   Mental Status: He is alert.  ? ? ? ? ? ?ED Results / Procedures / Treatments   ?Labs ?(all labs ordered are listed, but only abnormal results are displayed) ?Labs Reviewed - No data to display ? ?EKG ?None ? ?Radiology ?No results found. ? ?Procedures ?Drain paronychia ? ?Date/Time: 09/16/2021 6:39 PM ?Performed by: Tanda Rockers, PA-C ?Authorized by: Tanda Rockers, PA-C  ?Consent: Verbal consent obtained. ?Risks and benefits: risks, benefits and alternatives were discussed ?Local anesthesia used: yes ?Anesthesia: nerve block ? ?Anesthesia: ?Local anesthesia used: yes ?Local Anesthetic: lidocaine 1% without epinephrine ?Anesthetic total: 3 mL ? ?Sedation: ?Patient sedated: no ? ?Patient tolerance: patient tolerated the  procedure well with no immediate complications ?Comments: Lateral incision made to nailfold along medial aspect of R middle finger with moderate amount of purulent drainage. Finger palpated to express any additional purulent drainage out. Additional 0.5 cm laceration made to digital pulp with only scant bloody material expressed. Dressing applied to finger.  ? ?  ? ? ?Medications Ordered in ED ?Medications  ?lidocaine (PF) (XYLOCAINE) 1 % injection 5 mL (has no administration in time range)  ?doxycycline (VIBRA-TABS) tablet 100 mg (has no administration in time range)  ? ? ?ED Course/ Medical Decision  Making/ A&P ?  ?                        ?Medical Decision Making ?41 year old male who presents to the ED today with worsening right middle finger swelling and pain x1.5 weeks.  Recent I&D on 03/12 however only with minimal drainage.  Currently on Keflex without improvement.  On arrival to the ED patient is afebrile.  He is mildly tachycardic with heart rate of 109 and blood pressure 177/108.  Suspect secondary to pain.  On exam he is noted to have a very large paronychia to right middle finger along the medial aspect with swelling, tenderness palpation, redness to the digital pulp as well.  Tending physician Dr. Hyacinth Meeker has evaluated patient as well.  We will plan for incision and drainage along the nail fold as well as small incision to the digital pulp concern for possible early felon.  We will plan to discharge home with different antibiotic with coverage for MRSA.  ? ?I&D performed without difficulty. Moderate amount of purulent drainage expressed. Pt provided with 1 time dose of doxycycline in the ED and discharged home with same. Provided ortho follow up. He is in agreement with plan and stable for discharge home.  ? ?Risk ?Prescription drug management. ? ? ? ? ? ? ? ? ? ?Final Clinical Impression(s) / ED Diagnoses ?Final diagnoses:  ?Paronychia of finger of right hand  ? ? ?Rx / DC Orders ?ED Discharge Orders   ? ?      Ordered  ?  doxycycline (VIBRAMYCIN) 100 MG capsule  2 times daily       ? 09/16/21 1842  ? ?  ?  ? ?  ? ? ? ?Discharge Instructions   ? ?  ?Please pick up antibiotic and take as prescribed to cover for infection. Discontinue to Cephalexin you were prescribed during last ED visit.  ? ?Follow up with Dr. Melvyn Novas for further evaluation of your finger infection ? ?Return to the ED for any new/worsening symptoms ? ?You can take Ibuprofen and Tylenol as needed for pain ? ? ? ? ? ?  ?Tanda Rockers, PA-C ?09/16/21 1843 ? ?  ?Eber Hong, MD ?09/17/21 1455 ? ?

## 2021-09-16 NOTE — ED Triage Notes (Signed)
Pt here POV with c/o finger infection on right index finger. Pt here a week ago, lanced and sent home with antibiotics without improvement. Swelling noted. Pain 1/10 ?

## 2021-09-16 NOTE — ED Notes (Signed)
Discharge instructions and prescriptions reviewed and explained, pt verbalized understanding.  °

## 2021-09-16 NOTE — Discharge Instructions (Signed)
Please pick up antibiotic and take as prescribed to cover for infection. Discontinue to Cephalexin you were prescribed during last ED visit.  ? ?Follow up with Dr. Melvyn Novas for further evaluation of your finger infection ? ?Return to the ED for any new/worsening symptoms ? ?You can take Ibuprofen and Tylenol as needed for pain ?

## 2022-01-28 ENCOUNTER — Emergency Department (HOSPITAL_COMMUNITY)
Admission: EM | Admit: 2022-01-28 | Discharge: 2022-01-28 | Disposition: A | Discharge status: Home / Self Care | Payer: Medicaid Other | Attending: Emergency Medicine | Admitting: Emergency Medicine

## 2022-01-28 ENCOUNTER — Encounter (HOSPITAL_COMMUNITY): Payer: Self-pay

## 2022-01-28 ENCOUNTER — Other Ambulatory Visit: Payer: Self-pay

## 2022-01-28 DIAGNOSIS — H00012 Hordeolum externum right lower eyelid: Secondary | ICD-10-CM | POA: Insufficient documentation

## 2022-01-28 MED ORDER — CEPHALEXIN 500 MG PO CAPS: 500.0000 mg | ORAL_CAPSULE | Freq: Two times a day (BID) | ORAL | 0 refills | Status: AC

## 2022-01-28 NOTE — ED Provider Notes (Signed)
[facility]-EMERGENCY DEPT Provider Note   CSN: [id number] Arrival date & time: 01/28/22  6213     History  Chief Complaint  Patient presents with   Eye Pain    Elijah Walsh is a 41 y.o. male presenting to ED with concern for stye in right eye x 2 weeks, applying compresses warm at home.  Reports is not getting any better.  Typically managed around self.  HPI     Home Medications Prior to Admission medications   Medication Sig Start Date End Date Taking? Authorizing Provider  cephALEXin (KEFLEX) 500 MG capsule Take 1 capsule (500 mg total) by mouth 2 (two) times daily for 5 days. 01/28/22 02/02/22 Yes Ever Gustafson, Kermit Balo, MD  cephALEXin (KEFLEX) 500 MG capsule Take 1 capsule (500 mg total) by mouth 2 (two) times daily. 09/10/21   Roxy Horseman, PA-C  metroNIDAZOLE (FLAGYL) 500 MG tablet Take 1 tablet (500 mg total) by mouth 2 (two) times daily. 03/01/14   Antony Madura, PA-C      Allergies    Shellfish allergy    Review of Systems   Review of Systems  Physical Exam Updated Vital Signs BP (!) 162/110 (BP Location: Left Arm)   Pulse (!) 107   Temp 98.3 F (36.8 C) (Oral)   Resp 16   Ht 6' (1.829 m)   Wt (!) 142.9 kg   SpO2 100%   BMI 42.72 kg/m  Physical Exam Constitutional:      General: He is not in acute distress. HENT:     Head: Normocephalic and atraumatic.  Eyes:     Conjunctiva/sclera: Conjunctivae normal.     Pupils: Pupils are equal, round, and reactive to light.     Comments: Stye right lower eyelid spontaneously draining interiorly  Cardiovascular:     Rate and Rhythm: Normal rate and regular rhythm.     Pulses: Normal pulses.  Pulmonary:     Effort: Pulmonary effort is normal. No respiratory distress.  Abdominal:     General: There is no distension.     Tenderness: There is no abdominal tenderness.  Skin:    General: Skin is warm and dry.  Neurological:     General: No focal deficit present.     Mental Status: He is alert  and oriented to person, place, and time. Mental status is at baseline.  Psychiatric:        Mood and Affect: Mood normal.        Behavior: Behavior normal.     ED Results / Procedures / Treatments   Labs (all labs ordered are listed, but only abnormal results are displayed) Labs Reviewed - No data to display  EKG None  Radiology No results found.  Procedures Procedures    Medications Ordered in ED Medications - No data to display  ED Course/ Medical Decision Making/ A&P                           Medical Decision Making Risk Prescription drug management.   Patient is here with a stye on the right external eye, spontaneously draining on exam, there is a decent amount of swelling around the stye site, the eye itself is uninvolved.  Vision unaffected.  We will start him on Keflex for a few days.  Because he keeps getting there is recurring infections I did recommend ophthalmology follow-up, and he will do so.        Final Clinical  Impression(s) / ED Diagnoses Final diagnoses:  Hordeolum externum of right lower eyelid    Rx / DC Orders ED Discharge Orders          Ordered    cephALEXin (KEFLEX) 500 MG capsule  2 times daily        01/28/22 0836              Terald Sleeper, MD 01/28/22 [id number]

## 2022-01-28 NOTE — ED Triage Notes (Signed)
Patient reports a stye to the right eye and left eye x 2 weeks. Patient states he has been using a warm cloth. The right eye is not improving.
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
## 2023-08-17 ENCOUNTER — Emergency Department (HOSPITAL_COMMUNITY)
Admission: EM | Admit: 2023-08-17 | Discharge: 2023-08-17 | Disposition: A | Discharge status: Home / Self Care | Payer: BC Managed Care – PPO | Attending: Emergency Medicine | Admitting: Emergency Medicine

## 2023-08-17 ENCOUNTER — Other Ambulatory Visit: Payer: Self-pay

## 2023-08-17 ENCOUNTER — Encounter (HOSPITAL_COMMUNITY): Payer: Self-pay

## 2023-08-17 DIAGNOSIS — H5712 Ocular pain, left eye: Secondary | ICD-10-CM | POA: Diagnosis present

## 2023-08-17 DIAGNOSIS — H00015 Hordeolum externum left lower eyelid: Secondary | ICD-10-CM | POA: Insufficient documentation

## 2023-08-17 MED ORDER — TOBRAMYCIN 0.3 % OP SOLN: 1.0000 [drp] | OPHTHALMIC | 0 refills | Status: AC

## 2023-08-17 NOTE — ED Provider Notes (Signed)
 [place] EMERGENCY DEPARTMENT AT [facility] Provider Note   CSN: [id number] Arrival date & time: 08/17/23  1153     History  Chief Complaint  Patient presents with   Eye Pain    Elijah Walsh is a 43 y.o. male.  Pt complains of a stye on his left eyelid.  Pt has had in the past no visual changes   The history is provided by the patient. No language interpreter was used.  Eye Pain This is a new problem. The current episode started 2 days ago. The problem occurs constantly. Nothing aggravates the symptoms. Nothing relieves the symptoms. He has tried nothing for the symptoms.       Home Medications Prior to Admission medications   Medication Sig Start Date End Date Taking? Authorizing Provider  cephALEXin (KEFLEX) 500 MG capsule Take 1 capsule (500 mg total) by mouth 2 (two) times daily. 09/10/21   Roxy Horseman, PA-C  metroNIDAZOLE (FLAGYL) 500 MG tablet Take 1 tablet (500 mg total) by mouth 2 (two) times daily. 03/01/14   Antony Madura, PA-C  tobramycin (TOBREX) 0.3 % ophthalmic solution Place 1 drop into the right eye every 4 (four) hours for 10 days. 08/17/23 08/27/23 Yes Elson Areas, PA-C      Allergies    Shellfish allergy    Review of Systems   Review of Systems  Eyes:  Positive for pain.  All other systems reviewed and are negative.   Physical Exam Updated Vital Signs BP (!) 174/108 (BP Location: Right Arm)   Pulse 100   Temp 98.1 F (36.7 C) (Oral)   Resp 18   SpO2 100%  Physical Exam Vitals and nursing note reviewed.  Constitutional:      Appearance: He is well-developed.  HENT:     Head: Normocephalic.  Eyes:     Extraocular Movements: Extraocular movements intact.     Conjunctiva/sclera: Conjunctivae normal.     Pupils: Pupils are equal, round, and reactive to light.     Comments: Swelling left lower eyelid  obvious stye   Cardiovascular:     Rate and Rhythm: Normal rate.  Pulmonary:     Effort: Pulmonary effort is normal.   Musculoskeletal:        General: Normal range of motion.     Cervical back: Normal range of motion.  Skin:    General: Skin is warm.  Neurological:     General: No focal deficit present.     Mental Status: He is alert and oriented to person, place, and time.     ED Results / Procedures / Treatments   Labs (all labs ordered are listed, but only abnormal results are displayed) Labs Reviewed - No data to display  EKG None  Radiology No results found.  Procedures Procedures    Medications Ordered in ED Medications - No data to display  ED Course/ Medical Decision Making/ A&P                                 Medical Decision Making Risk Prescription drug management.           Final Clinical Impression(s) / ED Diagnoses Final diagnoses:  Hordeolum externum of left lower eyelid    Rx / DC Orders ED Discharge Orders          Ordered    tobramycin (TOBREX) 0.3 % ophthalmic solution  Every 4 hours  08/17/23 1222           An After Visit Summary was printed and given to the patient.    Elson Areas, [place] 08/17/23 1256    Terrilee Files, MD 08/18/23 0830

## 2023-08-17 NOTE — ED Triage Notes (Signed)
 Pt c.o stye on his left eye x 1 week.

## 2023-08-17 NOTE — Discharge Instructions (Addendum)
 Return if any problems.
